# Patient Record
Sex: Male | Born: 1950 | Race: Black or African American | Hispanic: No | Marital: Married | Smoking: Never smoker
Health system: Southern US, Community
[De-identification: ages and names within clinical notes are randomized; demographics above are authoritative.]

## PROBLEM LIST (undated history)

## (undated) DIAGNOSIS — H5789 Other specified disorders of eye and adnexa: Secondary | ICD-10-CM

## (undated) DIAGNOSIS — K635 Polyp of colon: Secondary | ICD-10-CM

## (undated) DIAGNOSIS — I10 Essential (primary) hypertension: Secondary | ICD-10-CM

## (undated) DIAGNOSIS — E7800 Pure hypercholesterolemia, unspecified: Secondary | ICD-10-CM

## (undated) DIAGNOSIS — C61 Malignant neoplasm of prostate: Secondary | ICD-10-CM

## (undated) HISTORY — PX: ROTATOR CUFF REPAIR: SHX139

## (undated) HISTORY — PX: KNEE CARTILAGE SURGERY: SHX688

## (undated) HISTORY — DX: Polyp of colon: K63.5

## (undated) HISTORY — PX: PROSTATE BIOPSY: SHX241

---

## 2002-12-02 ENCOUNTER — Ambulatory Visit (HOSPITAL_COMMUNITY): Admission: RE | Admit: 2002-12-02 | Discharge: 2002-12-02 | Payer: Self-pay | Admitting: Gastroenterology

## 2006-08-26 ENCOUNTER — Ambulatory Visit (HOSPITAL_BASED_OUTPATIENT_CLINIC_OR_DEPARTMENT_OTHER): Admission: RE | Admit: 2006-08-26 | Discharge: 2006-08-26 | Payer: Self-pay | Admitting: Orthopedic Surgery

## 2012-09-06 ENCOUNTER — Ambulatory Visit: Payer: 59

## 2012-09-06 ENCOUNTER — Ambulatory Visit (INDEPENDENT_AMBULATORY_CARE_PROVIDER_SITE_OTHER): Payer: 59 | Admitting: Family Medicine

## 2012-09-06 VITALS — BP 136/80 | HR 90 | Temp 99.4°F | Resp 16 | Ht 73.0 in | Wt 191.0 lb

## 2012-09-06 DIAGNOSIS — R059 Cough, unspecified: Secondary | ICD-10-CM

## 2012-09-06 DIAGNOSIS — J019 Acute sinusitis, unspecified: Secondary | ICD-10-CM

## 2012-09-06 DIAGNOSIS — R05 Cough: Secondary | ICD-10-CM

## 2012-09-06 DIAGNOSIS — J309 Allergic rhinitis, unspecified: Secondary | ICD-10-CM

## 2012-09-06 MED ORDER — FLUTICASONE PROPIONATE 50 MCG/ACT NA SUSP: 2.0000 | Freq: Every day | NASAL | Status: DC

## 2012-09-06 MED ORDER — AMOXICILLIN-POT CLAVULANATE 875-125 MG PO TABS: 1.0000 | ORAL_TABLET | Freq: Two times a day (BID) | ORAL | Status: DC

## 2012-09-06 NOTE — Progress Notes (Signed)
  [facility]:  Office Visit  Chief Complaint:  Chief Complaint  Patient presents with  . Cough    productive cough body aches chills nausea x 1 week  wife dix'd with pneumonia  . Headache  . Sore Throat    HPI: Henry Harrison is a 62 y.o. male who complains of  Cough x 1 week, dry, choked on possible mucus this AM so decided to come in. + chills. + frontal Ha. + buzzing in ear. No fevers. Tried Robitussin this AM x 1 with minimal reief. Wife  Dx with PNA this week. Nonsmoker. Denies allergies or asthma. Denies CP, wheezing, SOB.  History reviewed. No pertinent past medical history. History reviewed. No pertinent past surgical history. History   Social History  . Marital Status: Married    Spouse Name: N/A    Number of Children: N/A  . Years of Education: N/A   Social History Main Topics  . Smoking status: Never Smoker   . Smokeless tobacco: None  . Alcohol Use: No  . Drug Use: No  . Sexually Active: None   Other Topics Concern  . None   Social History Narrative  . None   Family History  Problem Relation Age of Onset  . Diabetes Mother    No Known Allergies Prior to Admission medications   Medication Sig Start Date End Date Taking? Authorizing Provider  guaiFENesin (ROBITUSSIN) 100 MG/5ML liquid Take 200 mg by mouth 3 (three) times daily as needed for cough.   Yes Historical Provider, MD     ROS: The patient denies fevers, night sweats, unintentional weight loss, chest pain, palpitations, wheezing, dyspnea on exertion, nausea, vomiting, abdominal pain, dysuria, hematuria, melena, numbness, weakness, or tingling.  All other systems have been reviewed and were otherwise negative with the exception of those mentioned in the HPI and as above.    PHYSICAL EXAM: Filed Vitals:   09/06/12 0821  BP: 136/80  Pulse: 90  Temp: 99.4 F (37.4 C)  Resp: 16   Filed Vitals:   09/06/12 0821  Height: 6\' 1"  (1.854 m)  Weight: 191 lb (86.637 kg)    Body mass index is 25.2 kg/(m^2).  General: Alert, no acute distress HEENT:  Normocephalic, atraumatic, oropharynx patent. + frontal sinus tenderness, erythematous nares. No exudates Cardiovascular:  Regular rate and rhythm, no rubs murmurs or gallops.  No Carotid bruits, radial pulse intact. No pedal edema.  Respiratory: Clear to auscultation bilaterally.  No wheezes, rales, or rhonchi.  No cyanosis, no use of accessory musculature GI: No organomegaly, abdomen is soft and non-tender, positive bowel sounds.  No masses. Skin: No rashes. Neurologic: Facial musculature symmetric. Psychiatric: Patient is appropriate throughout our interaction. Lymphatic: No cervical lymphadenopathy Musculoskeletal: Gait intact.   LABS: No results found for this or any previous visit.   EKG/XRAY:   Primary read interpreted by Dr. Conley Rolls at [facility]. No infiltrates, no pneumothorax No effusion ? Left upper lobe nodule 7 mm vs vascular changes   ASSESSMENT/PLAN: Encounter Diagnoses  Name Primary?  . Cough Yes  . Acute sinusitis   . Allergic rhinitis     Start taking claritin  Rx Flonase Rx Augmentin if no improvement in 3-4 days F/u prn   LE, THAO PHUONG, DO 09/06/2012 9:25 AM

## 2012-09-06 NOTE — Patient Instructions (Addendum)

## 2012-10-09 ENCOUNTER — Other Ambulatory Visit: Payer: Self-pay | Admitting: Gastroenterology

## 2013-02-26 ENCOUNTER — Encounter (HOSPITAL_COMMUNITY): Payer: Self-pay | Admitting: Emergency Medicine

## 2013-02-26 ENCOUNTER — Emergency Department (HOSPITAL_COMMUNITY)
Admission: EM | Admit: 2013-02-26 | Discharge: 2013-02-26 | Disposition: A | Discharge status: Home / Self Care | Payer: 59 | Attending: Emergency Medicine | Admitting: Emergency Medicine

## 2013-02-26 DIAGNOSIS — X58XXXA Exposure to other specified factors, initial encounter: Secondary | ICD-10-CM | POA: Insufficient documentation

## 2013-02-26 DIAGNOSIS — I10 Essential (primary) hypertension: Secondary | ICD-10-CM | POA: Insufficient documentation

## 2013-02-26 DIAGNOSIS — S161XXA Strain of muscle, fascia and tendon at neck level, initial encounter: Secondary | ICD-10-CM

## 2013-02-26 DIAGNOSIS — Y939 Activity, unspecified: Secondary | ICD-10-CM | POA: Insufficient documentation

## 2013-02-26 DIAGNOSIS — E7800 Pure hypercholesterolemia, unspecified: Secondary | ICD-10-CM | POA: Insufficient documentation

## 2013-02-26 DIAGNOSIS — S139XXA Sprain of joints and ligaments of unspecified parts of neck, initial encounter: Secondary | ICD-10-CM | POA: Insufficient documentation

## 2013-02-26 DIAGNOSIS — Y929 Unspecified place or not applicable: Secondary | ICD-10-CM | POA: Insufficient documentation

## 2013-02-26 DIAGNOSIS — Z79899 Other long term (current) drug therapy: Secondary | ICD-10-CM | POA: Insufficient documentation

## 2013-02-26 HISTORY — DX: Pure hypercholesterolemia, unspecified: E78.00

## 2013-02-26 HISTORY — DX: Essential (primary) hypertension: I10

## 2013-02-26 MED ORDER — DIAZEPAM 5 MG PO TABS: 5.0000 mg | ORAL_TABLET | Freq: Four times a day (QID) | ORAL | Status: DC | PRN

## 2013-02-26 MED ORDER — DIAZEPAM 5 MG/ML IJ SOLN: 2.5000 mg | Freq: Once | INTRAMUSCULAR | Status: DC

## 2013-02-26 MED ORDER — DIAZEPAM 5 MG PO TABS
2.5000 mg | ORAL_TABLET | Freq: Once | ORAL | Status: AC
  Administered 2013-02-26: 2.5 mg via ORAL
  Filled 2013-02-26: qty 1

## 2013-02-26 NOTE — ED Provider Notes (Signed)
Medical screening examination/treatment/procedure(s) were performed by non-physician practitioner and as supervising physician I was immediately available for consultation/collaboration.     Sheffield Hawker M Maryella Abood, MD 02/26/13 0233 

## 2013-02-26 NOTE — ED Provider Notes (Signed)
CSN: [id number]     Arrival date & time 02/26/13  0003 History   First MD Initiated Contact with Patient 02/26/13 0017     Chief Complaint  Patient presents with  . Torticollis   (Consider location/radiation/quality/duration/timing/severity/associated sxs/prior Treatment) HPI History provided by pt.   Pt presents w/ severe, sharp/spasming, L posterior neck pain x 2 days.  Radiates to L side of head. Constant but aggravated by movement.  Mild relief w/ warm compress.  Associated w/ HTN as well as 1 sec episodes of imbalance upon standing.  Denies fever, vision changes, ataxia, extremity weakness/paresthesias, CP/SOB.  Denies trauma. Pt concerned d/t FH cerebral aneurysm.    Past Medical History  Diagnosis Date  . Hypertension   . Hypercholesteremia    Past Surgical History  Procedure Laterality Date  . Rotator cuff repair     Family History  Problem Relation Age of Onset  . Diabetes Mother    History  Substance Use Topics  . Smoking status: Never Smoker   . Smokeless tobacco: Not on file  . Alcohol Use: No    Review of Systems  All other systems reviewed and are negative.    Allergies  Review of patient's allergies indicates no known allergies.  Home Medications   Current Outpatient Rx  Name  Route  Sig  Dispense  Refill  . b complex vitamins tablet   Oral   Take 1 tablet by mouth daily.         . IRON PO   Oral   Take 1 tablet by mouth daily.         Marland Kitchen OVER THE COUNTER MEDICATION   Oral   Take 2 capsules by mouth 2 (two) times daily. Marine B3         . ramipril (ALTACE) 5 MG capsule   Oral   Take 5 mg by mouth daily.          BP 164/104  Pulse 109  Temp(Src) 99.1 F (37.3 C) (Oral)  Resp 20  Ht 6\' 2"  (1.88 m)  Wt 190 lb (86.183 kg)  BMI 24.38 kg/m2  SpO2 95% Physical Exam  Nursing note and vitals reviewed. Constitutional: He is oriented to person, place, and time. He appears well-developed and well-nourished. No distress.  HENT:   Head: Normocephalic and atraumatic.  Eyes:  Normal appearance  Neck: Normal range of motion. Neck supple.  No carotid bruit   Cardiovascular: Normal rate, regular rhythm and intact distal pulses.   hypertensive  Pulmonary/Chest: Effort normal and breath sounds normal. No stridor.  Musculoskeletal: Normal range of motion.  Cervical spine non-tender.  Tenderness entire L trap.  Pain w/ minimal head rotation.    Neurological: He is alert and oriented to person, place, and time. No sensory deficit. Coordination normal.  CN 3-12 intact.  No nystagmus. 5/5 and equal upper and lower extremity strength.  No past pointing.     Skin: Skin is warm and dry. No rash noted.  Psychiatric: He has a normal mood and affect. His behavior is normal.    ED Course  Procedures (including critical care time) Labs Review Labs Reviewed - No data to display Imaging Review No results found.  EKG Interpretation   None       MDM   1. Cervical strain, initial encounter    62yo M w/ HTN and hypercholesterolemia presents w/ non-traumatic L posterior neck pain.  He is concerned because of associated HTN as well as FH cerebral aneurysm.  Exam most consistent w/ cervical strain.  No focal neuro deficits or meningeal signs.  No mid-line cervical ttp.  Pt reassured.  2.5mg  po valium ordered. Will reassess shortly.  1:07 AM   Pain has started to improve.  He is able to rotate his head a little easier.  BP mildly improved as well.  Will give him another 2.5mg  valium and d/c home w/ short course of same.  Recommended heating pad and avoidance of aggravating activities.  Strict return precautions discussed. 2:22 AM     Otilio Miu, PA-C 02/26/13 [phone]

## 2013-02-26 NOTE — ED Notes (Signed)
Pt c/o L sided neck pain onset yesterday, denies injury.

## 2013-07-01 ENCOUNTER — Ambulatory Visit
Admission: RE | Admit: 2013-07-01 | Discharge: 2013-07-01 | Disposition: A | Discharge status: Home / Self Care | Payer: 59 | Source: Ambulatory Visit | Attending: Internal Medicine | Admitting: Internal Medicine

## 2013-07-01 ENCOUNTER — Other Ambulatory Visit: Payer: Self-pay | Admitting: Internal Medicine

## 2013-07-01 DIAGNOSIS — M25519 Pain in unspecified shoulder: Secondary | ICD-10-CM

## 2013-07-13 ENCOUNTER — Other Ambulatory Visit: Payer: Self-pay | Admitting: Radiology

## 2013-07-13 ENCOUNTER — Other Ambulatory Visit: Payer: Self-pay | Admitting: Internal Medicine

## 2013-07-13 DIAGNOSIS — M25519 Pain in unspecified shoulder: Secondary | ICD-10-CM

## 2013-07-18 ENCOUNTER — Ambulatory Visit
Admission: RE | Admit: 2013-07-18 | Discharge: 2013-07-18 | Disposition: A | Discharge status: Home / Self Care | Payer: 59 | Source: Ambulatory Visit | Attending: Internal Medicine | Admitting: Internal Medicine

## 2013-07-18 DIAGNOSIS — M25519 Pain in unspecified shoulder: Secondary | ICD-10-CM

## 2013-07-21 ENCOUNTER — Ambulatory Visit
Admission: RE | Admit: 2013-07-21 | Discharge: 2013-07-21 | Disposition: A | Discharge status: Home / Self Care | Payer: 59 | Source: Ambulatory Visit | Attending: Internal Medicine | Admitting: Internal Medicine

## 2013-08-12 ENCOUNTER — Encounter (HOSPITAL_COMMUNITY): Payer: Self-pay | Admitting: Pharmacy Technician

## 2013-08-17 NOTE — Pre-Procedure Instructions (Signed)
Henry Harrison  08/17/2013   Your procedure is scheduled on:  Thurs, April 16 @ 1:00 PM  Report to [facility]  at 11:00 AM.  Call this number if you have problems the morning of surgery: [phone]   Remember:   Do not eat food or drink liquids after midnight.   Take these medicines the morning of surgery with A SIP OF WATER: Eye Drops(if needed)              No Goody's,BC's,Aleve,Aspirin,Ibuprofen,Fish Oil,or any Herbal Medications   Do not wear jewelry  Do not wear lotions, powders, or colognes. You may wear deodorant.  Men may shave face and neck.  Do not bring valuables to the hospital.  [facility] is not responsible                  for any belongings or valuables.               Contacts, dentures or bridgework may not be worn into surgery.  Leave suitcase in the car. After surgery it may be brought to your room.  For patients admitted to the hospital, discharge time is determined by your                treatment team.               Patients discharged the day of surgery will not be allowed to drive  home.    Special Instructions:  [facility] - Preparing for Surgery  Before surgery, you can play an important role.  Because skin is not sterile, your skin needs to be as free of germs as possible.  You can reduce the number of germs on you skin by washing with CHG (chlorahexidine gluconate) soap before surgery.  CHG is an antiseptic cleaner which kills germs and bonds with the skin to continue killing germs even after washing.  Please DO NOT use if you have an allergy to CHG or antibacterial soaps.  If your skin becomes reddened/irritated stop using the CHG and inform your nurse when you arrive at Short Stay.  Do not shave (including legs and underarms) for at least 48 hours prior to the first CHG shower.  You may shave your face.  Please follow these instructions carefully:   1.  Shower with CHG Soap the night before surgery and the                                 morning of Surgery.  2.  If you choose to wash your hair, wash your hair first as usual with your       normal shampoo.  3.  After you shampoo, rinse your hair and body thoroughly to remove the                      Shampoo.  4.  Use CHG as you would any other liquid soap.  You can apply chg directly       to the skin and wash gently with scrungie or a clean washcloth.  5.  Apply the CHG Soap to your body ONLY FROM THE NECK DOWN.        Do not use on open wounds or open sores.  Avoid contact with your eyes,       ears, mouth and genitals (private parts).  Wash genitals (private parts)  with your normal soap.  6.  Wash thoroughly, paying special attention to the area where your surgery        will be performed.  7.  Thoroughly rinse your body with warm water from the neck down.  8.  DO NOT shower/wash with your normal soap after using and rinsing off       the CHG Soap.  9.  Pat yourself dry with a clean towel.            10.  Wear clean pajamas.            11.  Place clean sheets on your bed the night of your first shower and do not        sleep with pets.  Day of Surgery  Do not apply any lotions/deoderants the morning of surgery.  Please wear clean clothes to the hospital/surgery center.     Please read over the following fact sheets that you were given: Pain Booklet, Coughing and Deep Breathing and Surgical Site Infection Prevention

## 2013-08-18 ENCOUNTER — Encounter (HOSPITAL_COMMUNITY)
Admission: RE | Admit: 2013-08-18 | Discharge: 2013-08-18 | Disposition: A | Discharge status: Home / Self Care | Payer: 59 | Source: Ambulatory Visit | Attending: Orthopedic Surgery | Admitting: Orthopedic Surgery

## 2013-08-18 ENCOUNTER — Encounter (HOSPITAL_COMMUNITY): Payer: Self-pay

## 2013-08-18 LAB — COMPREHENSIVE METABOLIC PANEL
ALBUMIN: 3.8 g/dL (ref 3.5–5.2)
ALT: 20 U/L (ref 0–53)
AST: 25 U/L (ref 0–37)
Alkaline Phosphatase: 74 U/L (ref 39–117)
BILIRUBIN TOTAL: 0.3 mg/dL (ref 0.3–1.2)
BUN: 17 mg/dL (ref 6–23)
CHLORIDE: 102 meq/L (ref 96–112)
CO2: 27 mEq/L (ref 19–32)
Calcium: 9.1 mg/dL (ref 8.4–10.5)
Creatinine, Ser: 0.94 mg/dL (ref 0.50–1.35)
GFR calc Af Amer: >90 mL/min (ref >90)
GFR calc non Af Amer: 87 mL/min — ABNORMAL LOW (ref >90)
Glucose, Bld: 89 mg/dL (ref 70–99)
Potassium: 4.3 mEq/L (ref 3.7–5.3)
SODIUM: 141 meq/L (ref 137–147)
Total Protein: 7.4 g/dL (ref 6.0–8.3)

## 2013-08-18 LAB — CBC WITH DIFFERENTIAL/PLATELET
BASOS ABS: 0 10*3/uL (ref 0.0–0.1)
Basophils Relative: 1 % (ref 0–1)
Eosinophils Absolute: 0.4 10*3/uL (ref 0.0–0.7)
Eosinophils Relative: 8 % — ABNORMAL HIGH (ref 0–5)
HCT: 43 % (ref 39.0–52.0)
HEMOGLOBIN: 14.2 g/dL (ref 13.0–17.0)
Lymphocytes Relative: 32 % (ref 12–46)
Lymphs Abs: 1.4 10*3/uL (ref 0.7–4.0)
MCH: 26.5 pg (ref 26.0–34.0)
MCHC: 33 g/dL (ref 30.0–36.0)
MCV: 80.4 fL (ref 78.0–100.0)
Monocytes Absolute: 0.3 10*3/uL (ref 0.1–1.0)
Monocytes Relative: 7 % (ref 3–12)
NEUTROS ABS: 2.3 10*3/uL (ref 1.7–7.7)
NEUTROS PCT: 52 % (ref 43–77)
PLATELETS: 161 10*3/uL (ref 150–400)
RBC: 5.35 MIL/uL (ref 4.22–5.81)
RDW: 13.8 % (ref 11.5–15.5)
WBC: 4.3 10*3/uL (ref 4.0–10.5)

## 2013-08-18 LAB — PROTIME-INR
INR: 1.02 (ref 0.00–1.49)
Prothrombin Time: 13.2 seconds (ref 11.6–15.2)

## 2013-08-18 LAB — APTT: APTT: 30 s (ref 24–37)

## 2013-08-18 NOTE — Progress Notes (Signed)
PCP is Dr Denton Ar hussain Denies seeing a cardiologist. Denies having a recent EKG and Echo. Denies ever having a stress test or card cath.

## 2013-08-19 MED ORDER — CHLORHEXIDINE GLUCONATE 4 % EX LIQD
60.0000 mL | Freq: Once | CUTANEOUS | Status: DC
  Filled 2013-08-19: qty 60

## 2013-08-19 MED ORDER — LACTATED RINGERS IV SOLN
INTRAVENOUS | Status: DC
  Administered 2013-08-20 (×2): via INTRAVENOUS

## 2013-08-19 MED ORDER — CEFAZOLIN SODIUM-DEXTROSE 2-3 GM-% IV SOLR
2.0000 g | INTRAVENOUS | Status: AC
  Administered 2013-08-20: 2 g via INTRAVENOUS
  Filled 2013-08-19: qty 50

## 2013-08-20 ENCOUNTER — Encounter (HOSPITAL_COMMUNITY): Payer: Self-pay | Admitting: Anesthesiology

## 2013-08-20 ENCOUNTER — Ambulatory Visit (HOSPITAL_COMMUNITY): Payer: 59 | Admitting: Anesthesiology

## 2013-08-20 ENCOUNTER — Encounter (HOSPITAL_COMMUNITY): Payer: 59 | Admitting: Anesthesiology

## 2013-08-20 ENCOUNTER — Encounter (HOSPITAL_COMMUNITY)
Admission: RE | Disposition: A | Discharge status: Home / Self Care | Payer: Self-pay | Source: Ambulatory Visit | Attending: Orthopedic Surgery

## 2013-08-20 ENCOUNTER — Ambulatory Visit (HOSPITAL_COMMUNITY)
Admission: RE | Admit: 2013-08-20 | Discharge: 2013-08-20 | Disposition: A | Discharge status: Home / Self Care | Payer: 59 | Source: Ambulatory Visit | Attending: Orthopedic Surgery | Admitting: Orthopedic Surgery

## 2013-08-20 DIAGNOSIS — W19XXXA Unspecified fall, initial encounter: Secondary | ICD-10-CM | POA: Insufficient documentation

## 2013-08-20 DIAGNOSIS — M719 Bursopathy, unspecified: Principal | ICD-10-CM | POA: Insufficient documentation

## 2013-08-20 DIAGNOSIS — Z01812 Encounter for preprocedural laboratory examination: Secondary | ICD-10-CM | POA: Insufficient documentation

## 2013-08-20 DIAGNOSIS — M19019 Primary osteoarthritis, unspecified shoulder: Secondary | ICD-10-CM | POA: Insufficient documentation

## 2013-08-20 DIAGNOSIS — Z01810 Encounter for preprocedural cardiovascular examination: Secondary | ICD-10-CM | POA: Insufficient documentation

## 2013-08-20 DIAGNOSIS — M67919 Unspecified disorder of synovium and tendon, unspecified shoulder: Secondary | ICD-10-CM | POA: Insufficient documentation

## 2013-08-20 DIAGNOSIS — M25819 Other specified joint disorders, unspecified shoulder: Secondary | ICD-10-CM | POA: Insufficient documentation

## 2013-08-20 DIAGNOSIS — S43429A Sprain of unspecified rotator cuff capsule, initial encounter: Secondary | ICD-10-CM | POA: Insufficient documentation

## 2013-08-20 DIAGNOSIS — M7580 Other shoulder lesions, unspecified shoulder: Secondary | ICD-10-CM

## 2013-08-20 DIAGNOSIS — E7800 Pure hypercholesterolemia, unspecified: Secondary | ICD-10-CM | POA: Insufficient documentation

## 2013-08-20 DIAGNOSIS — I10 Essential (primary) hypertension: Secondary | ICD-10-CM | POA: Insufficient documentation

## 2013-08-20 HISTORY — PX: SHOULDER ARTHROSCOPY WITH SUBACROMIAL DECOMPRESSION, ROTATOR CUFF REPAIR AND BICEP TENDON REPAIR: SHX5687

## 2013-08-20 SURGERY — SHOULDER ARTHROSCOPY WITH SUBACROMIAL DECOMPRESSION, ROTATOR CUFF REPAIR AND BICEP TENDON REPAIR
Anesthesia: Regional | Site: Shoulder | Laterality: Left

## 2013-08-20 MED ORDER — FENTANYL CITRATE 0.05 MG/ML IJ SOLN
INTRAMUSCULAR | Status: AC
  Administered 2013-08-20: 50 ug
  Filled 2013-08-20: qty 2

## 2013-08-20 MED ORDER — LACTATED RINGERS IV SOLN
INTRAVENOUS | Status: DC
  Administered 2013-08-20: 11:00:00 via INTRAVENOUS

## 2013-08-20 MED ORDER — PROPOFOL 10 MG/ML IV BOLUS
INTRAVENOUS | Status: DC | PRN
  Administered 2013-08-20: 190 mg via INTRAVENOUS

## 2013-08-20 MED ORDER — FENTANYL CITRATE 0.05 MG/ML IJ SOLN
INTRAMUSCULAR | Status: AC
  Filled 2013-08-20: qty 5

## 2013-08-20 MED ORDER — LIDOCAINE HCL (CARDIAC) 20 MG/ML IV SOLN
INTRAVENOUS | Status: DC | PRN
  Administered 2013-08-20: 40 mg via INTRAVENOUS

## 2013-08-20 MED ORDER — NEOSTIGMINE METHYLSULFATE 1 MG/ML IJ SOLN
INTRAMUSCULAR | Status: DC | PRN
  Administered 2013-08-20: 3 mg via INTRAVENOUS

## 2013-08-20 MED ORDER — ROCURONIUM BROMIDE 100 MG/10ML IV SOLN
INTRAVENOUS | Status: DC | PRN
  Administered 2013-08-20: 50 mg via INTRAVENOUS

## 2013-08-20 MED ORDER — PROPOFOL 10 MG/ML IV BOLUS
INTRAVENOUS | Status: AC
  Filled 2013-08-20: qty 20

## 2013-08-20 MED ORDER — ONDANSETRON HCL 4 MG/2ML IJ SOLN
INTRAMUSCULAR | Status: DC | PRN
  Administered 2013-08-20: 4 mg via INTRAVENOUS

## 2013-08-20 MED ORDER — MIDAZOLAM HCL 2 MG/2ML IJ SOLN
INTRAMUSCULAR | Status: AC
Start: 2013-08-20 — End: 2013-08-20
  Administered 2013-08-20: 1 mg
  Filled 2013-08-20: qty 2

## 2013-08-20 MED ORDER — NAPROXEN 500 MG PO TABS: 500.0000 mg | ORAL_TABLET | Freq: Two times a day (BID) | ORAL | Status: DC

## 2013-08-20 MED ORDER — ONDANSETRON HCL 4 MG/2ML IJ SOLN
INTRAMUSCULAR | Status: AC
  Filled 2013-08-20: qty 2

## 2013-08-20 MED ORDER — NEOSTIGMINE METHYLSULFATE 1 MG/ML IJ SOLN
INTRAMUSCULAR | Status: AC
  Filled 2013-08-20: qty 10

## 2013-08-20 MED ORDER — ONDANSETRON HCL 4 MG/2ML IJ SOLN
4.0000 mg | Freq: Once | INTRAMUSCULAR | Status: AC
  Administered 2013-08-20: 4 mg via INTRAVENOUS

## 2013-08-20 MED ORDER — PHENYLEPHRINE HCL 10 MG/ML IJ SOLN
INTRAMUSCULAR | Status: DC | PRN
  Administered 2013-08-20 (×8): 80 ug via INTRAVENOUS

## 2013-08-20 MED ORDER — DIAZEPAM 5 MG PO TABS: 2.5000 mg | ORAL_TABLET | Freq: Four times a day (QID) | ORAL | Status: DC | PRN

## 2013-08-20 MED ORDER — ROCURONIUM BROMIDE 50 MG/5ML IV SOLN
INTRAVENOUS | Status: AC
  Filled 2013-08-20: qty 1

## 2013-08-20 MED ORDER — FENTANYL CITRATE 0.05 MG/ML IJ SOLN
INTRAMUSCULAR | Status: DC | PRN
Start: 2013-08-20 — End: 2013-08-20
  Administered 2013-08-20: 100 ug via INTRAVENOUS

## 2013-08-20 MED ORDER — GLYCOPYRROLATE 0.2 MG/ML IJ SOLN
INTRAMUSCULAR | Status: DC | PRN
  Administered 2013-08-20: 0.2 mg via INTRAVENOUS
  Administered 2013-08-20: 0.4 mg via INTRAVENOUS

## 2013-08-20 MED ORDER — OXYCODONE-ACETAMINOPHEN 5-325 MG PO TABS: 1.0000 | ORAL_TABLET | ORAL | Status: DC | PRN

## 2013-08-20 MED ORDER — GLYCOPYRROLATE 0.2 MG/ML IJ SOLN
INTRAMUSCULAR | Status: AC
  Filled 2013-08-20: qty 2

## 2013-08-20 MED ORDER — PHENYLEPHRINE 40 MCG/ML (10ML) SYRINGE FOR IV PUSH (FOR BLOOD PRESSURE SUPPORT)
PREFILLED_SYRINGE | INTRAVENOUS | Status: AC
  Filled 2013-08-20: qty 20

## 2013-08-20 SURGICAL SUPPLY — 64 items
ANCHOR PEEK 4.75X19.1 SWLK C (Anchor) ×6 IMPLANT
ANCHOR PEEK CORKSCREW 4.5 (Anchor) ×2 IMPLANT
ANCHOR PEEK CORKSCREW 4.5MM (Anchor) ×1 IMPLANT
BLADE CUTTER GATOR 3.5 (BLADE) ×3 IMPLANT
BLADE GREAT WHITE 4.2 (BLADE) ×2 IMPLANT
BLADE GREAT WHITE 4.2MM (BLADE) ×1
BLADE SURG 11 STRL SS (BLADE) ×3 IMPLANT
BOOTCOVER CLEANROOM LRG (PROTECTIVE WEAR) ×6 IMPLANT
BUR 3.5 LG SPHERICAL (BURR) IMPLANT
BUR OVAL 4.0 (BURR) ×3 IMPLANT
BURR 3.5 LG SPHERICAL (BURR)
BURR 3.5MM LG SPHERICAL (BURR)
CANISTER SUCT LVC 12 LTR MEDI- (MISCELLANEOUS) ×3 IMPLANT
CANNULA ACUFLEX KIT 5X76 (CANNULA) ×3 IMPLANT
CANNULA DRILOCK 5.0MMX75MM (CANNULA) ×3
CANNULA DRILOCK 5.0X75 (CANNULA) ×6 IMPLANT
CLOSURE WOUND 1/2 X4 (GAUZE/BANDAGES/DRESSINGS) ×1
CONNECTOR 5 IN 1 STRAIGHT STRL (MISCELLANEOUS) IMPLANT
DRAPE INCISE 23X17 IOBAN STRL (DRAPES) ×2
DRAPE INCISE IOBAN 23X17 STRL (DRAPES) ×1 IMPLANT
DRAPE INCISE IOBAN 66X45 STRL (DRAPES) ×3 IMPLANT
DRAPE STERI 35X30 U-POUCH (DRAPES) ×3 IMPLANT
DRAPE SURG 17X11 SM STRL (DRAPES) ×3 IMPLANT
DRAPE U-SHAPE 47X51 STRL (DRAPES) ×3 IMPLANT
DRSG PAD ABDOMINAL 8X10 ST (GAUZE/BANDAGES/DRESSINGS) ×6 IMPLANT
DURAPREP 26ML APPLICATOR (WOUND CARE) ×6 IMPLANT
ELECT REM PT RETURN 9FT ADLT (ELECTROSURGICAL) ×3
ELECTRODE REM PT RTRN 9FT ADLT (ELECTROSURGICAL) ×1 IMPLANT
GLOVE BIO SURGEON STRL SZ7.5 (GLOVE) ×3 IMPLANT
GLOVE BIO SURGEON STRL SZ8 (GLOVE) ×3 IMPLANT
GLOVE EUDERMIC 7 POWDERFREE (GLOVE) ×3 IMPLANT
GLOVE SS BIOGEL STRL SZ 7.5 (GLOVE) ×1 IMPLANT
GLOVE SUPERSENSE BIOGEL SZ 7.5 (GLOVE) ×2
GOWN STRL REUS W/ TWL LRG LVL3 (GOWN DISPOSABLE) ×1 IMPLANT
GOWN STRL REUS W/ TWL XL LVL3 (GOWN DISPOSABLE) ×4 IMPLANT
GOWN STRL REUS W/TWL LRG LVL3 (GOWN DISPOSABLE) ×3
GOWN STRL REUS W/TWL XL LVL3 (GOWN DISPOSABLE) ×8
KIT BASIN OR (CUSTOM PROCEDURE TRAY) ×3 IMPLANT
KIT ROOM TURNOVER OR (KITS) ×3 IMPLANT
KIT SHOULDER TRACTION (DRAPES) ×3 IMPLANT
MANIFOLD NEPTUNE II (INSTRUMENTS) ×3 IMPLANT
NDL SUT 6 .5 CRC .975X.05 MAYO (NEEDLE) IMPLANT
NEEDLE MAYO TAPER (NEEDLE)
NEEDLE SPNL 18GX3.5 QUINCKE PK (NEEDLE) ×3 IMPLANT
NS IRRIG 1000ML POUR BTL (IV SOLUTION) ×3 IMPLANT
PACK SHOULDER (CUSTOM PROCEDURE TRAY) ×3 IMPLANT
PAD ABD 8X10 STRL (GAUZE/BANDAGES/DRESSINGS) ×3 IMPLANT
PAD ARMBOARD 7.5X6 YLW CONV (MISCELLANEOUS) ×6 IMPLANT
SET ARTHROSCOPY TUBING (MISCELLANEOUS) ×2
SET ARTHROSCOPY TUBING LN (MISCELLANEOUS) ×1 IMPLANT
SLING ARM LRG ADULT FOAM STRAP (SOFTGOODS) ×3 IMPLANT
SLING ARM MED ADULT FOAM STRAP (SOFTGOODS) IMPLANT
SPONGE GAUZE 4X4 12PLY (GAUZE/BANDAGES/DRESSINGS) ×3 IMPLANT
SPONGE LAP 4X18 X RAY DECT (DISPOSABLE) ×3 IMPLANT
STRIP CLOSURE SKIN 1/2X4 (GAUZE/BANDAGES/DRESSINGS) ×2 IMPLANT
SUT MNCRL AB 3-0 PS2 18 (SUTURE) ×3 IMPLANT
SUT PDS AB 0 CT 36 (SUTURE) IMPLANT
SUT RETRIEVER GRASP 30 DEG (SUTURE) ×3 IMPLANT
SYR 20CC LL (SYRINGE) IMPLANT
TAPE PAPER 3X10 WHT MICROPORE (GAUZE/BANDAGES/DRESSINGS) ×3 IMPLANT
TOWEL OR 17X24 6PK STRL BLUE (TOWEL DISPOSABLE) ×3 IMPLANT
TOWEL OR 17X26 10 PK STRL BLUE (TOWEL DISPOSABLE) ×3 IMPLANT
WAND SUCTION MAX 4MM 90S (SURGICAL WAND) ×3 IMPLANT
WATER STERILE IRR 1000ML POUR (IV SOLUTION) ×3 IMPLANT

## 2013-08-20 NOTE — Anesthesia Preprocedure Evaluation (Addendum)
Anesthesia Evaluation  Patient identified by MRN, date of birth, ID band  Reviewed: Allergy & Precautions, H&P , NPO status , Patient's Chart, lab work & pertinent test results  Airway Mallampati: I      Dental  (+) Teeth Intact   Pulmonary          Cardiovascular hypertension, Pt. on medications     Neuro/Psych    GI/Hepatic   Endo/Other    Renal/GU      Musculoskeletal   Abdominal   Peds  Hematology   Anesthesia Other Findings   Reproductive/Obstetrics                          Anesthesia Physical Anesthesia Plan  ASA: II  Anesthesia Plan: General   Post-op Pain Management:    Induction: Intravenous  Airway Management Planned: Oral ETT  Additional Equipment:   Intra-op Plan:   Post-operative Plan: Extubation in OR  Informed Consent: I have reviewed the patients History and Physical, chart, labs and discussed the procedure including the risks, benefits and alternatives for the proposed anesthesia with the patient or authorized representative who has indicated his/her understanding and acceptance.   Dental advisory given  Plan Discussed with: CRNA and Anesthesiologist  Anesthesia Plan Comments:         Anesthesia Quick Evaluation

## 2013-08-20 NOTE — Discharge Instructions (Signed)
° °Kevin M. Supple, M.D., F.A.A.O.S. °Orthopaedic Surgery °Specializing in Arthroscopic and Reconstructive °Surgery of the Shoulder and Knee °[phone] °[address] - [place], [place] [address] - Fax [phone] ° °POST-OP SHOULDER ARTHROSCOPIC ROTATOR CUFF  REPAIR INSTRUCTIONS ° °1. Call the office at [phone] to schedule your first post-op appointment 7-10 days from the date of your surgery. ° °2. Leave the steri-strips in place over your incisions when performing dressing changes and showering. You may remove your dressings and begin showering 72 hours from surgery. You can expect drainage that is clear to bloody in nature that occasionally will soak through your dressings. If this occurs go ahead and perform a dressing change. The drainage should lessen daily and when there is no drainage from your incisions feel free to go without a dressing. ° °3. Wear your sling/immobilizer at all times except to perform the exercises below or to occasionally let your arm dangle by your side to stretch your elbow. You also need to sleep in your sling immobilizer until instructed otherwise. ° °4. Range of motion to your elbow, wrist, and hand are encouraged 3-5 times daily. Exercise to your hand and fingers helps to reduce swelling you may experience. ° °5. Utilize ice to the shoulder 3-4 times minimum a day and additionally if you are experiencing pain. ° °6. You may one-armed drive when safely off of narcotics and muscle relaxants. You may use your hand that is in the sling to support the steering wheel only. However, should it be your right arm that is in the sling it is not to be used for gear shifting in a manual transmission. ° °7. If you had a block pre-operatively to provide post-op pain relief you may want to go ahead and begin utilizing your pain meds as your arm begins to wake up. Blocks can sometimes last up to 16-18 hours. If you are still pain-free prior to going to bed you may want to  strongly consider taking a pain medication to avoid being awakened in the night with the onset of pain. A muscle relaxant is also provided for you should you experience muscle spasms. It is recommended that if you are experiencing pain that your pain medication alone is not controlling, add the muscle relaxant along with the pain medication which can give additional pain relief. The first one to two days is generally the most severe of your pain and then should gradually decrease. As your pain lessens it is recommended that you decrease your use of the pain medications to an "as needed basis" only and to always comply with the recommended dosages of the pain medications. ° °8. Pain medications can produce constipation along with their use. If you experience this, the use of an over the counter stool softener or laxative daily is recommended.  ° °9. For additional questions or concerns, please do not hesitate to call the office. If after hours there is an answering service to forward your concerns to the physician on call. ° °POST-OP EXERCISES ° °Pendulum Exercises ° °Perform pendulum exercises while standing and bending at the waist. Support your uninvolved arm on a table or chair and allow your operated arm to hang freely. Make sure to do these exercises passively - not using you shoulder muscle. ° °Repeat 20 times. Do 3 sessions per day. ° °What to eat: ° °For your first meals, you should eat lightly; only small meals initially.  If you do not have nausea, you may eat larger meals.    Avoid spicy, greasy and heavy food.   ° °General Anesthesia, Adult, Care After  °Refer to this sheet in the next few weeks. These instructions provide you with information on caring for yourself after your procedure. Your health care provider may also give you more specific instructions. Your treatment has been planned according to current medical practices, but problems sometimes occur. Call your health care provider if you have any  problems or questions after your procedure.  °WHAT TO EXPECT AFTER THE PROCEDURE  °After the procedure, it is typical to experience:  °Sleepiness.  °Nausea and vomiting. °HOME CARE INSTRUCTIONS  °For the first 24 hours after general anesthesia:  °Have a responsible person with you.  °Do not drive a car. If you are alone, do not take public transportation.  °Do not drink alcohol.  °Do not take medicine that has not been prescribed by your health care provider.  °Do not sign important papers or make important decisions.  °You may resume a normal diet and activities as directed by your health care provider.  °Change bandages (dressings) as directed.  °If you have questions or problems that seem related to general anesthesia, call the hospital and ask for the anesthetist or anesthesiologist on call. °SEEK MEDICAL CARE IF:  °You have nausea and vomiting that continue the day after anesthesia.  °You develop a rash. °SEEK IMMEDIATE MEDICAL CARE IF:  °You have difficulty breathing.  °You have chest pain.  °You have any allergic problems. °Document Released: 07/30/2000 Document Revised: 12/24/2012 Document Reviewed: 11/06/2012  °ExitCare® Patient Information ©2014 [employer].  ° ° °

## 2013-08-20 NOTE — Op Note (Signed)
08/20/2013  2:38 PM  PATIENT:   Henry Harrison Seen  63 y.o. 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  ASSISTANTS: [place]ord pac   ANESTHESIA:   GET + ISB  EBL: min  SPECIMEN:  none  Drains: none   PATIENT DISPOSITION:  PACU - hemodynamically stable.    PLAN OF CARE: Discharge to home after PACU  Dictation# [id number], [phone]

## 2013-08-20 NOTE — Anesthesia Postprocedure Evaluation (Signed)
  Anesthesia Post-op Note  Patient: Henry Harrison  Procedure(s) Performed: Procedure(s): LEFT SHOULDER ARTHROSCOPY WITH SUBACROMIAL DECOMPRESSION, DISTAL CLAVICLE RESECTION AND ROTATOR CUFF REPAIR  (Left)  Patient Location: PACU  Anesthesia Type:GA combined with regional for post-op pain  Level of Consciousness: awake and alert   Airway and Oxygen Therapy: Patient Spontanous Breathing  Post-op Pain: none  Post-op Assessment: Post-op Vital signs reviewed, Patient's Cardiovascular Status Stable, Respiratory Function Stable, Patent Airway, No signs of Nausea or vomiting and Pain level controlled  Post-op Vital Signs: Reviewed and stable  Last Vitals:  Filed Vitals:   08/20/13 1600  BP: 151/97  Pulse: 88  Temp: 36.4 C  Resp: 6    Complications: No apparent anesthesia complications

## 2013-08-20 NOTE — Anesthesia Procedure Notes (Addendum)
Procedure Name: Intubation Date/Time: 08/20/2013 12:59 PM Performed by: Rush Farmer E Pre-anesthesia Checklist: Patient identified, Emergency Drugs available, Suction available, Patient being monitored and Timeout performed Patient Re-evaluated:Patient Re-evaluated prior to inductionOxygen Delivery Method: Circle system utilized Preoxygenation: Pre-oxygenation with 100% oxygen Intubation Type: IV induction Laryngoscope Size: Mac and 4 Grade View: Grade I Tube type: Oral Tube size: 7.5 mm Number of attempts: 1 Airway Equipment and Method: Stylet Placement Confirmation: ETT inserted through vocal cords under direct vision,  positive ETCO2 and breath sounds checked- equal and bilateral Secured at: 22 cm Tube secured with: Tape Dental Injury: Teeth and Oropharynx as per pre-operative assessment     Anesthesia Regional Block:   Narrative:    Anesthesia Regional Block:  Interscalene brachial plexus block  Pre-Anesthetic Checklist: ,, timeout performed, Correct Patient, Correct Site, Correct Laterality, Correct Procedure, Correct Position, site marked, Risks and benefits discussed,  Surgical consent,  Pre-op evaluation,  At surgeon's request and post-op pain management  Laterality: Left  Prep: chloraprep       Needles:  Injection technique: Single-shot  Needle Type: Echogenic Stimulator Needle     Needle Length: 9cm 9 cm     Additional Needles: Interscalene brachial plexus block Narrative:  Start time: 09/07/2013 11:40 AM End time: 09/07/2013 11:45 AM Injection made incrementally with aspirations every 5 mL.  Performed by: Personally   Additional Notes: 25 cc 0.5% marcaine 1:200 Epi injected easily   Anesthesia Regional Block:   Narrative:

## 2013-08-20 NOTE — H&P (Signed)
[place] Seen    Chief Complaint: left shoulder rotator cuff tear HPI: The patient is a 63 y.o. male with persistant right shoulder pain and weakness after a fall with MRI evidence of a large rotator cuff tear.  Past Medical History  Diagnosis Date  . Hypertension   . Hypercholesteremia     Past Surgical History  Procedure Laterality Date  . Rotator cuff repair    . Knee cartilage surgery Right     Family History  Problem Relation Age of Onset  . Diabetes Mother     Social History:  reports that he has never smoked. He does not have any smokeless tobacco history on file. He reports that he drinks alcohol. He reports that he does not use illicit drugs.  Allergies: No Known Allergies  Medications Prior to Admission  Medication Sig Dispense Refill  . b complex vitamins tablet Take 1 tablet by mouth daily.      . IRON PO Take 1 tablet by mouth daily.      . Olopatadine HCl (PATADAY) 0.2 % SOLN Place 1 drop into both eyes daily as needed (allergies in eyes).      Marland Kitchen OVER THE COUNTER MEDICATION Take 1 tablet by mouth daily. True Capros      . OVER THE COUNTER MEDICATION Take 1 tablet by mouth 2 (two) times daily. Striction BP      . ramipril (ALTACE) 5 MG capsule Take 5 mg by mouth daily.      Marland Kitchen OVER THE COUNTER MEDICATION Take 2 capsules by mouth 2 (two) times daily. Marine B3         Physical Exam: left shoulder with painful restricted motion and weakness as noted at recent office visits  Vitals  Temp:  [98 F (36.7 C)] 98 F (36.7 C) (04/16 1106) Pulse Rate:  [79-93] 79 (04/16 1145) Resp:  [16-20] 18 (04/16 1145) BP: (159-171)/(95-103) 163/99 mmHg (04/16 1145) SpO2:  [100 %] 100 % (04/16 1145)  Assessment/Plan  Impression: left shoulder rotator cuff tear  Plan of Action: Procedure(s): LEFT SHOULDER ARTHROSCOPY WITH SUBACROMIAL DECOMPRESSION, DISTAL CLAVICLE RESECTION AND ROTATOR CUFF REPAIR   Metta Clines Aydrian Halpin 08/20/2013, 12:13 PM

## 2013-08-20 NOTE — Transfer of Care (Signed)
Immediate Anesthesia Transfer of Care Note  Patient: Henry Harrison  Procedure(s) Performed: Procedure(s): LEFT SHOULDER ARTHROSCOPY WITH SUBACROMIAL DECOMPRESSION, DISTAL CLAVICLE RESECTION AND ROTATOR CUFF REPAIR  (Left)  Patient Location: PACU  Anesthesia Type:GA combined with regional for post-op pain  Level of Consciousness: awake, alert , oriented and patient cooperative  Airway & Oxygen Therapy: Patient Spontanous Breathing and Patient connected to nasal cannula oxygen  Post-op Assessment: Report given to PACU RN and Post -op Vital signs reviewed and stable  Post vital signs: Reviewed and stable  Complications: No apparent anesthesia complications

## 2013-08-21 NOTE — Op Note (Signed)
NAME:  Henry Harrison, Henry Harrison NO.:  [id number]  MEDICAL RECORD NO.:  [id number]  LOCATION:  [facility]                         FACILITY:  [facility]  PHYSICIAN:  Metta Clines. Demitrios Molyneux, M.D.  DATE OF BIRTH:  02-25-1951  DATE OF PROCEDURE: DATE OF DISCHARGE:  08/20/2013                              OPERATIVE REPORT   ADDENDUM:  Surgical assistant, Jenetta Loges, PA-C.  Surgical assistant essential for help throughout this case for help with positioning of the patient, positioning of the extremity, management of the arthroscopic equipment, tissue manipulation, suture management, wound closure, and intraoperative decision making.     [name], M.D.     KMS/MEDQ  D:  08/20/2013  T:  08/21/2013  Job:  [id number]

## 2013-08-21 NOTE — Op Note (Signed)
NAME:  Henry Harrison, Henry Harrison NO.:  [id number]  MEDICAL RECORD NO.:  [id number]  LOCATION:  [facility]                         FACILITY:  [facility]  PHYSICIAN:  Metta Clines. Samiya Mervin, M.D.  DATE OF BIRTH:  08-30-1950  DATE OF PROCEDURE:  08/20/2013 DATE OF DISCHARGE:  08/20/2013                              OPERATIVE REPORT   PREOPERATIVE DIAGNOSES: 1. Left shoulder rotator cuff tears. 2. Left shoulder impingement syndrome. 3. Left shoulder acromioclavicular joint arthropathy.  POSTOPERATIVE DIAGNOSES: 1. 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 2. Left shoulder impingement. 3. Left shoulder acromioclavicular joint arthrosis. 4. Early glenohumeral joint arthrosis with broad area of full-     thickness chondral loss, cartilage loss on the humeral head. 5. Left shoulder labral tear.  PROCEDURES: 1. Left shoulder examination under anesthesia and left shoulder     glenohumeral joint diagnostic arthroscopy. 2. Labral debridement and limited synovectomy. 3. Arthroscopic subacromial decompression bursectomy. 4. Arthroscopic distal clavicle resection. 5. Arthroscopic rotator cuff repair of a full-thickness and likely     acute on chronic tear of the rotator cuff involving the majority of     the supraspinatus and upper infraspinatus using a double row     __________ construct.  SURGEON:  Metta Clines. Song Garris, M.D.  Terrence DupontOlivia Mackie A. Shuford, PA-C  ANESTHESIA:  General endotracheal as well as an interscalene block.  ESTIMATED BLOOD LOSS:  Minimal.  DRAINS:  None.  HISTORY:  Mr. Zeidman is a 63 year old gentleman who had a work- related injury to his left shoulder after a fall.  He has had a continued difficulties with pain, weakness and restrictions in mobility. His preoperative MRI scan does confirm a significant tear of the rotator cuff with moderate retraction.  Due to his ongoing pain, weakness and functional limitations, he is brought to the operating at this time  for planned left shoulder arthroscopy as described below.  Preoperatively, I counseled Ms. Simmon regarding treatment options as well as risks versus benefits thereof.  Possible surgical complications were all reviewed including the potential for bleeding, infection, neurovascular injury, persistent pain, loss of motion, anesthetic complication, recurrence of rotator cuff tear and possible need for additional surgery.  He understands and accepts and agrees with our planned procedure.  PROCEDURE IN DETAIL:  After undergoing routine preop evaluation, the patient did receive prophylactic antibiotics.  An interscalene block was established in the holding area by the Anesthesia Department.  Placed supine on the operating table, underwent smooth induction of a general endotracheal anesthesia.  Turned to the right lateral decubitus position on a beanbag and appropriately padded and protected.  Left shoulder examination under anesthesia revealed modest restrictions in mobility and gentle manipulation was performed.  No significant release of adhesions was noted and achieved approximately 160 degrees of forward elevation, 90 degrees of rotation.  Left arm was then suspended at 70 degrees of abduction with 10 pounds of traction.  Left shoulder girdle region was sterilely prepped and draped in standard fashion.  Time-out was called.  __________ glenohumeral joint __________ established under direct visualization.  Immediately evident was a significant synovitis within the glenohumeral joint as well as degenerative labral tearing. We performed a limited synovectomy as well  as labral debridement.  We were surprised to find a significant area of full-thickness cartilage loss over the central aspect of the humeral head approximately 3-4 cm in diameter of the circular zone of cartilage loss.  The glenoid was in good condition.  No instability patterns were noted.  We completed the debridement  within the glenohumeral joint.  Fluid was removed.  Of note, we did indeed find a full-thickness tear of the rotator cuff, but when viewed from the articular side, this did not appear to be significantly displaced.  At this point, fluid and instruments were removed.  The arm was dropped down to 30 degrees abduction with the arthroscope introduced into the subacromial space through the posterior portal and a direct lateral portal was established in the subacromial space.  Abundant dense bursal tissue and multiple adhesions were encountered and these were all divided and excised, with the shaver and the Stryker wand.  The wand was then used for the periosteum from the undersurface of the anterior half of the acromion and a subacromial decompression was performed with a bur creating type 1 morphology.  Portal was then established directly anterior to the distal clavicle and the distal clavicle resection was performed with a bur.  Care was taken to confirm visualization of entire circumference of the distal clavicle to ensure adequate removal of bone.  At this point, we completed inspection of the subacromial space and found that there was essentially a horizontal split and a cleavage tear through the rotator cuff from the bursal-sided leaflet of the torn rotator cuff, was significantly more retracted then the articular-sided portion and the bursal-sided portion which again involved predominantly the infraspinatus.  It was chronic in nature, and this tissue was markedly attenuated and with fatty infiltration and this was not readily mobilizable and as a matter of fact, was very scarred down.  We were, however, able to mobilize the articular side of the leaflet of this complex tear and this was what appears to have been the portion that was still functioning with the patient.  Given the fact that the bursal-sided portion of this tear which I would __________ for perhaps a 40% of thickness of  the tendon. This area was debrided and we focused on repairing the articular side of the tear which again was the majority of this tear.  This was mobilized. We trimmed the free margin of the tendon back to the healthy tissue and then prepared the greater tuberosity, removing soft tissue and gently abrading the bone.  An accessory portal __________ was established and through __________ margin of the acromion placed an Arthrex PEEK corkscrew suture anchor.  The limbs of the suture anchor were then placed through the free margin of the rotator cuff in a horizontal mattress pattern.  These were then tied with sliding locking knots followed by multiple overhand throws and alternating posts.  We then further created "suture bridge" with 2 swivel lock suture anchors, which nicely compressed the margin of the rotator cuff against the bony bed of the tuberosity.  The overall construct was much to our satisfaction, and again did identify that there was complex horizontal element and the majority of the tendon was on the articular side which we repaired to the satisfaction and the bursal side of tissues appeared to be chronically torn and quite friable and the irreparable.  At this point, we then completed the bursectomy.  Hemostasis was obtained.  Fluid and instrument was removed.  The portal was closed with Monocryl  and a Steri- Strips.  A dry dressing taped at the left shoulder and left arm was placed in a sling.  The patient was awakened, extubated, and taken to recovery room in stable condition.     Metta Clines. Jalyn Rosero, M.D.     KMS/MEDQ  D:  08/20/2013  T:  08/21/2013  Job:  [id number]

## 2013-08-24 ENCOUNTER — Encounter (HOSPITAL_COMMUNITY): Payer: Self-pay | Admitting: Orthopedic Surgery

## 2013-08-25 NOTE — Addendum Note (Signed)
Addendum created 08/25/13 1630 by Roberts Gaudy, MD   Modules edited: Anesthesia Attestations, Anesthesia Blocks and Procedures, Clinical Notes   Clinical Notes:  File: [id number]; File: [id number]; File: [id number]

## 2013-09-07 NOTE — Addendum Note (Signed)
Addendum created 09/07/13 0741 by Roberts Gaudy, MD   Modules edited: Anesthesia Blocks and Procedures, Clinical Notes   Clinical Notes:  File: [id number]; File: [id number]

## 2015-12-02 ENCOUNTER — Encounter: Payer: Self-pay | Admitting: Medical Oncology

## 2015-12-08 ENCOUNTER — Encounter: Payer: Self-pay | Admitting: Genetic Counselor

## 2015-12-08 ENCOUNTER — Telehealth: Payer: Self-pay | Admitting: Medical Oncology

## 2015-12-08 NOTE — Telephone Encounter (Signed)
Oncology Nurse Navigator Documentation  Oncology Nurse Navigator Flowsheets 12/02/2015 12/08/2015  Navigator Location [facility] -  Navigator Encounter Type Introductory phone call Telephone  Telephone - Outgoing Call;Appt- Confirmation/Clarification- Mr. Damboise confirmed his appointment for 8/4 arriving at 7:30am. He states that he has not received the packet of information that I mailed to him. I asked him not to worry and if he does not receive today I will have a set for him to fill out in the morning. He voiced understanding.  Abnormal Finding Date 09/05/2015 -  Confirmed Diagnosis Date 10/21/2015 -  Barriers/Navigation Needs Education -  Education Actor Options;Coping with Diagnosis/ Prognosis -  Interventions Education Method -  Education Method Teach-back;Verbal;Written -  Support Groups/Services Friends and Family -  Acuity Level 2 -  Acuity Level 2 Initial guidance, education and coordination as needed;Educational needs -  Time Spent with Patient 15 15

## 2015-12-08 NOTE — Progress Notes (Signed)
GU Location of Tumor / Histology: prostatic adenocarcinoma  If Prostate Cancer, Gleason Score is (3 + 4) and PSA is (4.48)  PEARLEY THRESHER presented 09/05/15 as a referral from Dr. Lysle Rubens for evaluation of elevated PSA.  March 2014 PSA 2.18 July 2013 PSA 3.46 March 2016 PSA 3.31 July 2015 PSA 4.1  Biopsies of prostate (if applicable) revealed:    Past/Anticipated interventions by urology, if any: biopsy, referral to [facility]  Past/Anticipated interventions by medical oncology, if any: no  Weight changes, if any: no  Bowel/Bladder complaints, if any: some trouble maintaining an erection  Nausea/Vomiting, if any: no  Pain issues, if any:  no  SAFETY ISSUES:  Prior radiation? no  Pacemaker/ICD? no  Possible current pregnancy? no  Is the patient on methotrexate? no  Current Complaints / other details:  65 year old male. Married with two children. Retired.

## 2015-12-08 NOTE — Progress Notes (Signed)
I called pt to introduce myself as the Prostate Nurse Navigator and the Coordinator of the [facility].  1. I confirmed with the patient he is aware of his referral to the clinic August 4th arriving at 7:30am.  2. I discussed the format of the clinic and the physicians he will be seeing that day.  3. I discussed where the clinic is located and how to contact me.  4. I confirmed his address and informed him I would be mailing a packet of information and forms to be completed. I asked him to bring them with him the day of his appointment.   He voiced understanding of the above. I asked him to call me if he has any questions or concerns regarding his appointments or the forms he needs to complete.

## 2015-12-09 ENCOUNTER — Ambulatory Visit
Admission: RE | Admit: 2015-12-09 | Discharge: 2015-12-09 | Disposition: A | Discharge status: Home / Self Care | Payer: Medicare Other | Source: Ambulatory Visit | Attending: Radiation Oncology | Admitting: Radiation Oncology

## 2015-12-09 ENCOUNTER — Encounter: Payer: Self-pay | Admitting: Medical Oncology

## 2015-12-09 ENCOUNTER — Ambulatory Visit (HOSPITAL_BASED_OUTPATIENT_CLINIC_OR_DEPARTMENT_OTHER): Payer: Medicare Other | Admitting: Oncology

## 2015-12-09 DIAGNOSIS — I10 Essential (primary) hypertension: Secondary | ICD-10-CM | POA: Insufficient documentation

## 2015-12-09 DIAGNOSIS — C61 Malignant neoplasm of prostate: Secondary | ICD-10-CM | POA: Diagnosis not present

## 2015-12-09 DIAGNOSIS — E7800 Pure hypercholesterolemia, unspecified: Secondary | ICD-10-CM | POA: Diagnosis not present

## 2015-12-09 HISTORY — DX: Malignant neoplasm of prostate: C61

## 2015-12-09 NOTE — Progress Notes (Signed)
Radiation Oncology         [phone] ________________________________  Initial Outpatient Consultation  Name: Henry Harrison MRN: [id number]  Date: 12/09/2015  DOB: [birth year]  [id number], MD  Henry Low, MD   REFERRING PHYSICIAN: Wenda Low, MD  DIAGNOSIS: 65 y.o. gentleman with stage T1c adenocarcinoma of the prostate with a Gleason's score of 3+4 and a PSA of 4.48    ICD-9-CM ICD-10-CM   1. Malignant neoplasm of prostate ([place]) [name] is a 65 y.o. gentleman.  He was noted to have an elevated PSA of 4.48 by his primary care physician, Dr. Lysle Harrison.  Accordingly, he was referred for evaluation in urology by Dr. Junious Harrison on 09/05/2015. Digital rectal examination was performed 10/21/2015 revealing prostate size 2+ with no palpable nodules.  The patient proceeded to transrectal ultrasound with 12 biopsies of the prostate on 10/21/2015.  The prostate volume measured 37 cc.  Out of 12 core biopsies, 5 were positive.  The maximum Gleason score was 3+4, and this was seen in the distribution below.     The patient reviewed the biopsy results with his urologist and he has kindly been referred today for discussion of potential radiation treatment options.  PREVIOUS RADIATION THERAPY: No  PAST MEDICAL HISTORY:  has a past medical history of Hypercholesteremia; Hypertension; and Prostate cancer ([place]).    PAST SURGICAL HISTORY: Past Surgical History:  Procedure Laterality Date  . KNEE CARTILAGE SURGERY Right   . PROSTATE BIOPSY    . ROTATOR CUFF REPAIR    . SHOULDER ARTHROSCOPY WITH SUBACROMIAL DECOMPRESSION, ROTATOR CUFF REPAIR AND BICEP TENDON REPAIR Left 08/20/2013   Procedure: LEFT SHOULDER ARTHROSCOPY WITH SUBACROMIAL DECOMPRESSION, DISTAL CLAVICLE RESECTION AND ROTATOR CUFF REPAIR ;  Surgeon: Henry Shutter, MD;  Location: [facility];  Service: Orthopedics;  Laterality: Left;    FAMILY HISTORY: family history includes Diabetes  in his mother.  SOCIAL HISTORY:  reports that he has never smoked. He has never used smokeless tobacco. He reports that he drinks alcohol. He reports that he does not use drugs.  ALLERGIES: Review of patient's allergies indicates no known allergies.  MEDICATIONS:  Current Outpatient Prescriptions  Medication Sig Dispense Refill  . b complex vitamins tablet Take 1 tablet by mouth daily.    . diazepam (VALIUM) 5 MG tablet Take 0.5-1 tablets (2.5-5 mg total) by mouth every 6 (six) hours as needed for muscle spasms or sedation. 40 tablet 1  . IRON PO Take 1 tablet by mouth daily.    . naproxen (NAPROSYN) 500 MG tablet Take 1 tablet (500 mg total) by mouth 2 (two) times daily with a meal. 60 tablet 1  . Olopatadine HCl (PATADAY) 0.2 % SOLN Place 1 drop into both eyes daily as needed (allergies in eyes).    Marland Kitchen OVER THE COUNTER MEDICATION Take 2 capsules by mouth 2 (two) times daily. Marine B3    . OVER THE COUNTER MEDICATION Take 1 tablet by mouth daily. True Capros    . OVER THE COUNTER MEDICATION Take 1 tablet by mouth 2 (two) times daily. Striction BP    . oxyCODONE-acetaminophen (PERCOCET) 5-325 MG per tablet Take 1-2 tablets by mouth every 4 (four) hours as needed. 40 tablet 0  . ramipril (ALTACE) 5 MG capsule Take 5 mg by mouth daily.     No current facility-administered medications for this encounter.     REVIEW OF SYSTEMS: On review of systems, the patient reports  that he is doing well overall. He denies any unintended weight changes. He denies any bowel disturbances. He denies abdominal pain, nausea or vomiting. He mentions he has some trouble maintaining an erection. A complete review of systems is obtained and is otherwise negative.  The patient completed an IPSS and IIEF questionnaire.  His IPSS score was 3 indicating mild urinary outflow obstructive symptoms. He indicated that his erectile function is able to complete sexual activity almost always.   PHYSICAL EXAM: This patient is in  no acute distress.  He is alert and oriented.   vitals were not taken for this visit. He exhibits no respiratory distress or labored breathing.  He appears neurologically intact.  His mood is pleasant.  His affect is appropriate.  Please note the digital rectal exam findings described above.  KPS = 100  100 - Normal; no complaints; no evidence of disease. 90   - Able to carry on normal activity; minor signs or symptoms of disease. 80   - Normal activity with effort; some signs or symptoms of disease. 34   - Cares for self; unable to carry on normal activity or to do active work. 60   - Requires occasional assistance, but is able to care for most of his personal needs. 50   - Requires considerable assistance and frequent medical care. 35   - Disabled; requires special care and assistance. 74   - Severely disabled; hospital admission is indicated although death not imminent. 18   - Very sick; hospital admission necessary; active supportive treatment necessary. 10   - Moribund; fatal processes progressing rapidly. 0     - Dead  Karnofsky DA, Abelmann Purcell, Craver LS and Webster JH [phone]) The use of the nitrogen mustards in the palliative treatment of carcinoma: with particular reference to bronchogenic carcinoma Cancer 1 634-56   LABORATORY DATA:  Lab Results  Component Value Date   WBC 4.3 08/18/2013   HGB 14.2 08/18/2013   HCT 43.0 08/18/2013   MCV 80.4 08/18/2013   PLT 161 08/18/2013   Lab Results  Component Value Date   NA 141 08/18/2013   K 4.3 08/18/2013   CL 102 08/18/2013   CO2 27 08/18/2013   Lab Results  Component Value Date   ALT 20 08/18/2013   AST 25 08/18/2013   ALKPHOS 74 08/18/2013   BILITOT 0.3 08/18/2013     RADIOGRAPHY: No results found.    IMPRESSION: This gentleman is a 65 yo gentleman with adenocarcinoma of the prostate.  His T-Stage, Gleason's Score, and PSA put him into the intermediate risk group.  Accordingly he is eligible for a variety of  potential treatment options including external radiotherapy, seed implant, or surgery.  PLAN: Today, I talked to the patient and family about the findings and work-up thus far.  We discussed the natural history of prostate cancer and general treatment, highlighting the role of radiotherapy in the management.  We discussed the available radiation techniques, and focused on the details of logistics and delivery.  We reviewed the anticipated acute and late sequelae associated with radiation in this setting. The patient was encouraged to ask questions that I answered to the best of my ability. We discussed that he is also a candidate for surgery and he is leaning towards a prostatectomy at this time.   I spent 40 minutes face to face with the patient and more than 50% of that time was spent in counseling and/or coordination of care.   ------------------------------------------------  Sheral Apley Tammi Klippel, M.D.    This document serves as a record of services personally performed by Tyler Pita, MD. It was created on his behalf by Lendon Collar, a trained medical scribe. The creation of this record is based on the scribe's personal observations and the provider's statements to them. This document has been checked and approved by the attending provider.

## 2015-12-09 NOTE — Progress Notes (Signed)
Reason for Referral: Prostate cancer.   HPI: 65 year old gentleman currently of [place] where he lived the majority of his life. He is a reasonably healthy gentleman with history of hypertension and hyperlipidemia who was found to have an elevated PSA of 4.48. He was evaluated by Dr. Junious Silk and a biopsy obtained on June 2017 showed a Gleason score 3+4 = 7 in 5 cores predominantly on the left side with moderate volume. He remains asymptomatic at this time without any lower urinary tract symptoms. He denied any nocturia, hematuria or frequency. He remains active with excellent health and shape without any other symptoms. He denied any family history of prostate cancer. 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  He denied any headaches, blurry vision, syncope or seizures. He does not report any fevers, chills or sweats. He does not report any cough, wheezing or hemoptysis. He does not report any chest pain, palpitation orthopnea. He does not report any nausea, vomiting or abdominal pain. He does not report any constipation or diarrhea. He does not report any skeletal complaints. Remaining review of systems unremarkable.   Past Medical History:  Diagnosis Date  . Hypercholesteremia   . Hypertension   . Prostate cancer [facility])   :  Past Surgical History:  Procedure Laterality Date  . KNEE CARTILAGE SURGERY Right   . PROSTATE BIOPSY    . ROTATOR CUFF REPAIR    . SHOULDER ARTHROSCOPY WITH SUBACROMIAL DECOMPRESSION, ROTATOR CUFF REPAIR AND BICEP TENDON REPAIR Left 08/20/2013   Procedure: LEFT SHOULDER ARTHROSCOPY WITH SUBACROMIAL DECOMPRESSION, DISTAL CLAVICLE RESECTION AND ROTATOR CUFF REPAIR ;  Surgeon: Marin Shutter, MD;  Location: [facility];  Service: Orthopedics;  Laterality: Left;  :   Current Outpatient Prescriptions:  .  b complex vitamins tablet, Take 1 tablet by mouth daily., Disp: , Rfl:  .  diazepam (VALIUM) 5 MG tablet, Take 0.5-1 tablets (2.5-5 mg total) by mouth  every 6 (six) hours as needed for muscle spasms or sedation., Disp: 40 tablet, Rfl: 1 .  IRON PO, Take 1 tablet by mouth daily., Disp: , Rfl:  .  naproxen (NAPROSYN) 500 MG tablet, Take 1 tablet (500 mg total) by mouth 2 (two) times daily with a meal., Disp: 60 tablet, Rfl: 1 .  Olopatadine HCl (PATADAY) 0.2 % SOLN, Place 1 drop into both eyes daily as needed (allergies in eyes)., Disp: , Rfl:  .  OVER THE COUNTER MEDICATION, Take 2 capsules by mouth 2 (two) times daily. Marine B3, Disp: , Rfl:  .  OVER THE COUNTER MEDICATION, Take 1 tablet by mouth daily. True Capros, Disp: , Rfl:  .  OVER THE COUNTER MEDICATION, Take 1 tablet by mouth 2 (two) times daily. Striction BP, Disp: , Rfl:  .  oxyCODONE-acetaminophen (PERCOCET) 5-325 MG per tablet, Take 1-2 tablets by mouth every 4 (four) hours as needed., Disp: 40 tablet, Rfl: 0 .  ramipril (ALTACE) 5 MG capsule, Take 5 mg by mouth daily., Disp: , Rfl: :  No Known Allergies:  Family History  Problem Relation Age of Onset  . Diabetes Mother   :  Social History   Social History  . Marital status: Married    Spouse name: N/A  . Number of children: N/A  . Years of education: N/A   Occupational History  . Not on file.   Social History Main Topics  . Smoking status: Never Smoker  . Smokeless tobacco: Never Used  . Alcohol use Yes  Comment: maybe once a year a few shots  . Drug use: No  . Sexual activity: Yes   Other Topics Concern  . Not on file   Social History Narrative  . No narrative on file  :  Pertinent items are noted in HPI.  Exam: ECOG 0 General appearance: alert and cooperative well appearing gentleman without distress. Head: Normocephalic, without obvious abnormality anicteric. Throat: lips, mucosa, and tongue normal; teeth and gums normal. No oral ulcers or lesions. Neck: no adenopathy or masses. Back: negative Resp: clear to auscultation bilaterally no wheezing or dullness to percussion. Chest wall: no  tenderness to palpation. Cardio: regular rate and rhythm, S1, S2 normal, no murmur, click, rub or gallop GI: soft, non-tender; bowel sounds normal; no masses,  no organomegaly no shifting dullness or ascites. Extremities: extremities normal, atraumatic, no cyanosis or edema Pulses: 2+ and symmetric Skin: Skin color, texture, turgor normal. No rashes or lesions      Assessment and Plan:    65 year old with prostate cancer diagnosed in June 2017. He was found to have a Gleason score of 3+4 = 7 in 5 cores of the left side of the prostate. His PSA is 4.48 with clinical stage of T1c.  His case was discussed today the prostate cancer multidisciplinary clinic. His pathology was discussed with the reviewing pathologist. Options of therapy were reviewed with the patient which include surgical therapy with a radical prostatectomy versus radiation therapy as a definitive modality. Risks and benefits of both approaches were reviewed and the efficacy have been discussed which is equivalent.  He is favoring surgical therapy which she would be a reasonable candidate for. See no role for systemic therapy at this point especially in the setting of a definitive surgical therapy for T1c disease. All his questions were answered today to his satisfaction.

## 2015-12-09 NOTE — Progress Notes (Signed)
                               Care Plan Summary  Name: Henry Harrison, Henry Harrison DOB: 06-05-1950   Your Medical Team:   Urologist -  Dr. Raynelle Bring, [facility]  Radiation Oncologist - Dr. Tyler Pita, [facility]   Medical Oncologist - Dr. Zola Button, [facility]  Recommendations: 1) Surgery-Robotic Prostatectomy 2) Radiation Therapy  3) Brachytherapy  * These recommendations are based on information available as of today's consult.      Recommendations may change depending on the results of further tests or exams.  Next Steps: 1) Dr. Lynne [facility] office will call you to schedule surgery    When appointments need to be scheduled, you will be contacted by [facility] and/or [facility].  Questions?  Please do not hesitate to call Cira Rue, RN, BSN, OCN at [phone] any questions or concerns.  Shirlean Mylar is your Oncology Nurse Navigator and is available to assist you while you're receiving your medical care at [facility].

## 2015-12-09 NOTE — Consult Note (Signed)
Multi-Disciplinary Clinic     12/09/2015   --------------------------------------------------------------------------------   Henry Harrison  MRN: [id number]  PRIMARY CARE:  Wenda Low, MD  DOB: January 04, 1951, 65 year old Male  REFERRING:  Georgette Dover, MD  SSN:-[id number]  PROVIDER:  Festus Aloe, M.D.    TREATING:  Raynelle Bring, M.D.   --------------------------------------------------------------------------------   CC/HPI: CC: Prostate Cancer   Physician requesting consult: Dr. Eda Keys  PCP: Dr. Wenda Low  Location of consult: [facility] - Prostate Cancer Multidisciplinary Clinic   Henry Harrison is a 65 year old gentleman who was noted to have an elevated PSA of 4.48 prompting a TRUS biopsy of the prostate demonstrating Gleason 3+4=7 adenocarcinoma of the prostate with 5 out of 12 biopsy cores positive for malignancy.   Family history: None   Imaging studies: N/A   PMH: He has a history of hypertension.  PSH: No abdominal surgeries.   TNM stage: cT1c Nx Mx  PSA: 4.48  Gleason score: 3+4=7  Biopsy (10/21/15): 5/12 cores positive  Left: L lateral apex (20%, 3+4=7), L apex (10%, 3+4=7), L lateral mid (80%, 3+4=7), L mid (50%, 3+4=7), L lateral base (30%, 3+4=7)  Right: Benign  Prostate volume: 37.0 cc   Nomogram  OC disease: 44%  EPE: 55%  SVI: 4%  LNI: 3%  PFS (5 year, 10 year): 89%,81%   Urinary function: IPSS is 3.  Erectile function: SHIM score is 25.     ALLERGIES: ramipril    MEDICATIONS: AmLODIPine Besylate 5 MG Oral Tablet Oral  Grape Seed Extract TABS Oral  Green Tea CAPS Oral  LevoFLOXacin 500 MG Oral Tablet Oral  Pomegranate CAPS Oral  Vitamin B-12 TABS Oral  Vitamin K TABS Oral     GU PSH: Prostate Needle Biopsy - 10/21/2015      PSH Notes: Rotator Cuff Repair   NON-GU PSH: Surgical Pathology, Gross And Microscopic Examination For Prostate Needle - 10/21/2015    GU PMH: Prostate Cancer -  11/24/2015 Elevated PSA - 10/21/2015, Elevated PSA, PSA elevation - 09/07/2015 BPH w/o LUTS, BPH without urinary obstruction - 09/05/2015    NON-GU PMH: Encounter for general adult medical examination without abnormal findings, Encounter for preventive health examination - 09/05/2015 Personal history of other diseases of the circulatory system, History of hypertension - 09/05/2015    FAMILY HISTORY: Diabetes - Runs In Family Glaucoma - Runs In Family heart failure - Runs In Family Hypertension - Runs In Family   SOCIAL HISTORY: None    Notes: Never a smoker, Father deceased, Retired, Two children, Married, Daily caffeine consumption, 1 serving a day, No alcohol use   REVIEW OF SYSTEMS:    GU Review Male:   Patient denies frequent urination, hard to postpone urination, burning/ pain with urination, get up at night to urinate, leakage of urine, stream starts and stops, trouble starting your streams, and have to strain to urinate .  Gastrointestinal (Lower):   Patient denies diarrhea and constipation.  Gastrointestinal (Upper):   Patient denies nausea and vomiting.  Constitutional:   Patient denies fever, night sweats, weight loss, and fatigue.  Skin:   Patient denies skin rash/ lesion and itching.  Eyes:   Patient denies blurred vision and double vision.  Ears/ Nose/ Throat:   Patient denies sore throat and sinus problems.  Hematologic/Lymphatic:   Patient denies swollen glands and easy bruising.  Cardiovascular:   Patient denies leg swelling and chest pains.  Respiratory:   Patient denies cough and shortness  of breath.  Endocrine:   Patient denies excessive thirst.  Musculoskeletal:   Patient denies back pain and joint pain.  Neurological:   Patient denies headaches and dizziness.  Psychologic:   Patient denies depression and anxiety.   VITAL SIGNS: None   GU PHYSICAL EXAMINATION:    Prostate: 40 cc with no nodularity or induration.  Seminal Vesicles: Nonpalpable.  Sphincter Tone: Normal  sphincter. No rectal tenderness. No rectal mass.    MULTI-SYSTEM PHYSICAL EXAMINATION:    Constitutional: Well-nourished. No physical deformities. Normally developed. Good grooming.  Neck: Neck symmetrical, not swollen. Normal tracheal position.  Respiratory: No labored breathing, no use of accessory muscles.   Cardiovascular: Normal temperature, normal extremity pulses, no swelling, no varicosities.  Lymphatic: No enlargement of neck, axillae, groin.  Skin: No paleness, no jaundice, no cyanosis. No lesion, no ulcer, no rash.  Neurologic / Psychiatric: Oriented to time, oriented to place, oriented to person. No depression, no anxiety, no agitation.  Gastrointestinal: No mass, no tenderness, no rigidity, non obese abdomen.  Eyes: Normal conjunctivae. Normal eyelids.  Ears, Nose, Mouth, and Throat: Left ear no scars, no lesions, no masses. Right ear no scars, no lesions, no masses. Nose no scars, no lesions, no masses. Normal hearing. Normal lips.  Musculoskeletal: Normal gait and station of head and neck.     PAST DATA REVIEWED:  Source Of History:  Patient  Lab Test Review:   PSA  Records Review:   Pathology Reports, Previous Patient Records   09/06/15  PSA  Total PSA 4.48   Free PSA 0.66   % Free PSA 15     PROCEDURES: None   ASSESSMENT:      ICD-10 Details  1 GU:   Prostate Cancer - C61    PLAN:           Document Letter(s):  Created for Patient: Clinical Summary         Notes:   1. Prostate cancer: The patient was counseled about the natural history of prostate cancer and the standard treatment options that are available for prostate cancer. It was explained to him how his age and life expectancy, clinical stage, Gleason score, and PSA affect his prognosis, the decision to proceed with additional staging studies, as well as how that information influences recommended treatment strategies. We discussed the roles for active surveillance, radiation therapy, surgical therapy,  androgen deprivation, as well as ablative therapy options for the treatment of prostate cancer as appropriate to his individual cancer situation. We discussed the risks and benefits of these options with regard to their impact on cancer control and also in terms of potential adverse events, complications, and impact on quality of life particularly related to urinary and sexual function. The patient was encouraged to ask questions throughout the discussion today and all questions were answered to his stated satisfaction. In addition, the patient was provided with and/or directed to appropriate resources and literature for further education about prostate cancer and treatment options.   We discussed surgical therapy for prostate cancer including the different available surgical approaches. We discussed, in detail, the risks and expectations of surgery with regard to cancer control, urinary control, and erectile function as well as the expected postoperative recovery process. Additional risks of surgery including but not limited to bleeding, infection, hernia formation, nerve damage, lymphocele formation, bowel/rectal injury potentially necessitating colostomy, damage to the urinary tract resulting in urine leakage, urethral stricture, and the cardiopulmonary risks such as myocardial infarction, stroke, death, venothromboembolism, etc.  were explained. The risk of open surgical conversion for robotic/laparoscopic prostatectomy was also discussed.   He has seen Dr. Tammi Klippel and Dr. Alen Blew today as well. He ultimately has made the decision to proceed with surgical treatment. He will be scheduled for a bilateral nerve sparing RAL radical prostatectomy and BPLND.   cc: Dr. Eda Keys  Dr. Wenda Low  Dr. Tyler Pita  Dr. Zola Button           E & M CODE: I spent at least 40 minutes face to face with the patient, more than 50% of that time was spent on counseling and/or coordinating care.         The information contained in this medical record document is considered private and confidential patient information. This information can only be used for the medical diagnosis and/or medical services that are being provided by the patient's selected caregivers. This information can only be distributed outside of the patient's care if the patient agrees and signs waivers of authorization for this information to be sent to an outside source or route.

## 2015-12-12 ENCOUNTER — Encounter: Payer: Self-pay | Admitting: General Practice

## 2015-12-12 ENCOUNTER — Other Ambulatory Visit: Payer: Self-pay | Admitting: Urology

## 2015-12-12 NOTE — Progress Notes (Signed)
[place] Psychosocial Distress Screening Spiritual Care  Met with [name] in [facility] to introduce [place] team/resources, reviewing distress screen per protocol.  The patient scored a 4 on the Psychosocial Distress Thermometer which indicates moderate distress. Also assessed for distress and other psychosocial needs.   ONCBCN DISTRESS SCREENING 12/12/2015  Screening Type Initial Screening  Distress experienced in past week (1-10) 4  Emotional problem type Nervousness/Anxiety  Referral to support programs Yes  Other Spiritual Care   [name] verbalized appreciation for [facility], noting that clarity about his medical situation and opportunity to meet with team helped decrease his distress to a 2. He reports good support from his wife and appears to be very self-aware, paying attention to his limits (for example, after rotator cuff surgery) and practicing self-care accordingly. He served in the [employer] in [place].  He notes that "fall is my season," sharing how outdoor time and deer hunting are integral to his meaning-making and spiritual/emotional life.   Follow up needed: No.  Pt indicates no needs at this time and is aware of ongoing [place] team/programming availability, but please also page if needs arise/circumstances change.  Thank you.  [place], [place], [facility] Pager [phone] Voicemail [phone]

## 2015-12-13 NOTE — Addendum Note (Signed)
Encounter addended by: Heywood Footman, RN on: 12/13/2015 11:59 AM<BR>    Actions taken: Charge Capture section accepted

## 2015-12-13 NOTE — Addendum Note (Signed)
Encounter addended by: Heywood Footman, RN on: 12/13/2015  4:15 PM<BR>    Actions taken: Vitals modified

## 2015-12-14 ENCOUNTER — Encounter: Payer: Self-pay | Admitting: Radiation Oncology

## 2015-12-14 NOTE — Progress Notes (Signed)
Retired. Married to [employer]. Two children, Brig and [place]. Colonoscopy done 08/2011. Denies performing self testicular exams. Wears glasses. Reports sinus problems and a sore throat. Reports back pain.

## 2015-12-14 NOTE — Addendum Note (Signed)
Encounter addended by: Heywood Footman, RN on: 12/14/2015  9:00 AM<BR>    Actions taken: Reconcile Outside Information medication data discarded, Outside historical medications filed, Home Medications modified, Medication taking status modified, Order Reconciliation Section accessed, Sign clinical note, Flowsheet accepted

## 2015-12-20 NOTE — Patient Instructions (Addendum)
Henry Harrison  12/20/2015   Your procedure is scheduled on: 01-02-16   Report to [facility] Main  Entrance take [facility]  elevators to 3rd floor to  [place] at  [place]  AM.  Call this number if you have problems the morning of surgery [phone] Follow any bowel prep instructions per Dr. Lynne Logan office- Drink clear liquids plentiful day before surgery.   Remember: ONLY 1 PERSON MAY GO WITH YOU TO SHORT STAY TO GET  READY MORNING OF YOUR SURGERY.  Do not eat food or drink liquids :After Midnight.     Take these medicines the morning of surgery with A SIP OF WATER: Amlodipine. Eye drops usual. DO NOT TAKE ANY DIABETIC MEDICATIONS DAY OF YOUR SURGERY                               You may not have any metal on your body including hair pins and              piercings  Do not wear jewelry, make-up, lotions, powders or perfumes, deodorant             Do not wear nail polish.  Do not shave  48 hours prior to surgery.              Men may shave face and neck.   Do not bring valuables to the hospital. [place].  Contacts, dentures or bridgework may not be worn into surgery.  Leave suitcase in the car. After surgery it may be brought to your room.     Patients discharged the day of surgery will not be allowed to drive home.  Name and phone number of your driver:Henry Harrison - spouse [phone] cell  Special Instructions: N/A              Please read over the following fact sheets you were given: _____________________________________________________________________             [facility] - Preparing for Surgery Before surgery, you can play an important role.  Because skin is not sterile, your skin needs to be as free of germs as possible.  You can reduce the number of germs on your skin by washing with CHG (chlorahexidine gluconate) soap before surgery.  CHG is an antiseptic cleaner which kills germs and bonds  with the skin to continue killing germs even after washing. Please DO NOT use if you have an allergy to CHG or antibacterial soaps.  If your skin becomes reddened/irritated stop using the CHG and inform your nurse when you arrive at Short Stay. Do not shave (including legs and underarms) for at least 48 hours prior to the first CHG shower.  You may shave your face/neck. Please follow these instructions carefully:  1.  Shower with CHG Soap the night before surgery and the  morning of Surgery.  2.  If you choose to wash your hair, wash your hair first as usual with your  normal  shampoo.  3.  After you shampoo, rinse your hair and body thoroughly to remove the  shampoo.  4.  Use CHG as you would any other liquid soap.  You can apply chg directly  to the skin and wash                       Gently with a scrungie or clean washcloth.  5.  Apply the CHG Soap to your body ONLY FROM THE NECK DOWN.   Do not use on face/ open                           Wound or open sores. Avoid contact with eyes, ears mouth and genitals (private parts).                       Wash face,  Genitals (private parts) with your normal soap.             6.  Wash thoroughly, paying special attention to the area where your surgery  will be performed.  7.  Thoroughly rinse your body with warm water from the neck down.  8.  DO NOT shower/wash with your normal soap after using and rinsing off  the CHG Soap.                9.  Pat yourself dry with a clean towel.            10.  Wear clean pajamas.            11.  Place clean sheets on your bed the night of your first shower and do not  sleep with pets. Day of Surgery : Do not apply any lotions/deodorants the morning of surgery.  Please wear clean clothes to the hospital/surgery center.  FAILURE TO FOLLOW THESE INSTRUCTIONS MAY RESULT IN THE CANCELLATION OF YOUR SURGERY PATIENT SIGNATURE_________________________________  NURSE  SIGNATURE__________________________________  ________________________________________________________________________   Henry Harrison  An incentive spirometer is a tool that can help keep your lungs clear and active. This tool measures how well you are filling your lungs with each breath. Taking long deep breaths may help reverse or decrease the chance of developing breathing (pulmonary) problems (especially infection) following:  A long period of time when you are unable to move or be active. BEFORE THE PROCEDURE   If the spirometer includes an indicator to show your best effort, your nurse or respiratory therapist will set it to a desired goal.  If possible, sit up straight or lean slightly forward. Try not to slouch.  Hold the incentive spirometer in an upright position. INSTRUCTIONS FOR USE  1. Sit on the edge of your bed if possible, or sit up as far as you can in bed or on a chair. 2. Hold the incentive spirometer in an upright position. 3. Breathe out normally. 4. Place the mouthpiece in your mouth and seal your lips tightly around it. 5. Breathe in slowly and as deeply as possible, raising the piston or the ball toward the top of the column. 6. Hold your breath for 3-5 seconds or for as long as possible. Allow the piston or ball to fall to the bottom of the column. 7. Remove the mouthpiece from your mouth and breathe out normally. 8. Rest for a few seconds and repeat Steps 1 through 7 at least 10 times every 1-2 hours when you are awake. Take your time and take a few normal breaths between deep breaths. 9. The spirometer may include an indicator to show  your best effort. Use the indicator as a goal to work toward during each repetition. 10. After each set of 10 deep breaths, practice coughing to be sure your lungs are clear. If you have an incision (the cut made at the time of surgery), support your incision when coughing by placing a pillow or rolled up towels firmly  against it. Once you are able to get out of bed, walk around indoors and cough well. You may stop using the incentive spirometer when instructed by your caregiver.  RISKS AND COMPLICATIONS  Take your time so you do not get dizzy or light-headed.  If you are in pain, you may need to take or ask for pain medication before doing incentive spirometry. It is harder to take a deep breath if you are having pain. AFTER USE  Rest and breathe slowly and easily.  It can be helpful to keep track of a log of your progress. Your caregiver can provide you with a simple table to help with this. If you are using the spirometer at home, follow these instructions: [place] IF:   You are having difficultly using the spirometer.  You have trouble using the spirometer as often as instructed.  Your pain medication is not giving enough relief while using the spirometer.  You develop fever of 100.5 F (38.1 C) or higher. SEEK IMMEDIATE MEDICAL CARE IF:   You cough up bloody sputum that had not been present before.  You develop fever of 102 F (38.9 C) or greater.  You develop worsening pain at or near the incision site. MAKE SURE YOU:   Understand these instructions.  Will watch your condition.  Will get help right away if you are not doing well or get worse. Document Released: 09/03/2006 Document Revised: 07/16/2011 Document Reviewed: 11/04/2006 ExitCare Patient Information 2014 ExitCare, [place].   ________________________________________________________________________  WHAT IS A BLOOD TRANSFUSION? Blood Transfusion Information  A transfusion is the replacement of blood or some of its parts. Blood is made up of multiple cells which provide different functions.  Red blood cells carry oxygen and are used for blood loss replacement.  White blood cells fight against infection.  Platelets control bleeding.  Plasma helps clot blood.  Other blood products are available for  specialized needs, such as hemophilia or other clotting disorders. BEFORE THE TRANSFUSION  Who gives blood for transfusions?   Healthy volunteers who are fully evaluated to make sure their blood is safe. This is blood bank blood. Transfusion therapy is the safest it has ever been in the practice of medicine. Before blood is taken from a donor, a complete history is taken to make sure that person has no history of diseases nor engages in risky social behavior (examples are intravenous drug use or sexual activity with multiple partners). The donor's travel history is screened to minimize risk of transmitting infections, such as malaria. The donated blood is tested for signs of infectious diseases, such as HIV and hepatitis. The blood is then tested to be sure it is compatible with you in order to minimize the chance of a transfusion reaction. If you or a relative donates blood, this is often done in anticipation of surgery and is not appropriate for emergency situations. It takes many days to process the donated blood. RISKS AND COMPLICATIONS Although transfusion therapy is very safe and saves many lives, the main dangers of transfusion include:   Getting an infectious disease.  Developing a transfusion reaction. This is an allergic reaction to  something in the blood you were given. Every precaution is taken to prevent this. The decision to have a blood transfusion has been considered carefully by your caregiver before blood is given. Blood is not given unless the benefits outweigh the risks. AFTER THE TRANSFUSION  Right after receiving a blood transfusion, you will usually feel much better and more energetic. This is especially true if your red blood cells have gotten low (anemic). The transfusion raises the level of the red blood cells which carry oxygen, and this usually causes an energy increase.  The nurse administering the transfusion will monitor you carefully for complications. HOME CARE  INSTRUCTIONS  No special instructions are needed after a transfusion. You may find your energy is better. Speak with your caregiver about any limitations on activity for underlying diseases you may have. SEEK MEDICAL CARE IF:   Your condition is not improving after your transfusion.  You develop redness or irritation at the intravenous (IV) site. SEEK IMMEDIATE MEDICAL CARE IF:  Any of the following symptoms occur over the next 12 hours:  Shaking chills.  You have a temperature by mouth above 102 F (38.9 C), not controlled by medicine.  Chest, back, or muscle pain.  People around you feel you are not acting correctly or are confused.  Shortness of breath or difficulty breathing.  Dizziness and fainting.  You get a rash or develop hives.  You have a decrease in urine output.  Your urine turns a dark color or changes to pink, red, or brown. Any of the following symptoms occur over the next 10 days:  You have a temperature by mouth above 102 F (38.9 C), not controlled by medicine.  Shortness of breath.  Weakness after normal activity.  The white part of the eye turns yellow (jaundice).  You have a decrease in the amount of urine or are urinating less often.  Your urine turns a dark color or changes to pink, red, or brown. Document Released: 04/20/2000 Document Revised: 07/16/2011 Document Reviewed: 12/08/2007 [facility] Patient Information 2014 [place], [place].  _______________________________________________________________________

## 2015-12-21 ENCOUNTER — Encounter (HOSPITAL_COMMUNITY): Payer: Self-pay

## 2015-12-21 ENCOUNTER — Ambulatory Visit (HOSPITAL_COMMUNITY)
Admission: RE | Admit: 2015-12-21 | Discharge: 2015-12-21 | Disposition: A | Discharge status: Home / Self Care | Payer: Medicare Other | Source: Ambulatory Visit | Attending: Urology | Admitting: Urology

## 2015-12-21 ENCOUNTER — Encounter (HOSPITAL_COMMUNITY)
Admission: RE | Admit: 2015-12-21 | Discharge: 2015-12-21 | Disposition: A | Discharge status: Home / Self Care | Payer: Medicare Other | Source: Ambulatory Visit | Attending: Urology | Admitting: Urology

## 2015-12-21 DIAGNOSIS — Z01818 Encounter for other preprocedural examination: Secondary | ICD-10-CM | POA: Insufficient documentation

## 2015-12-21 DIAGNOSIS — R938 Abnormal findings on diagnostic imaging of other specified body structures: Secondary | ICD-10-CM | POA: Insufficient documentation

## 2015-12-21 DIAGNOSIS — C61 Malignant neoplasm of prostate: Secondary | ICD-10-CM | POA: Insufficient documentation

## 2015-12-21 HISTORY — DX: Other specified disorders of eye and adnexa: H57.89

## 2015-12-21 LAB — BASIC METABOLIC PANEL
Anion gap: 5 (ref 5–15)
BUN: 16 mg/dL (ref 6–20)
CHLORIDE: 106 mmol/L (ref 101–111)
CO2: 28 mmol/L (ref 22–32)
CREATININE: 0.89 mg/dL (ref 0.61–1.24)
Calcium: 9.1 mg/dL (ref 8.9–10.3)
GFR calc Af Amer: >60 mL/min (ref >60)
GFR calc non Af Amer: >60 mL/min (ref >60)
GLUCOSE: 91 mg/dL (ref 65–99)
Potassium: 3.9 mmol/L (ref 3.5–5.1)
SODIUM: 139 mmol/L (ref 135–145)

## 2015-12-21 LAB — CBC
HEMATOCRIT: 41.3 % (ref 39.0–52.0)
Hemoglobin: 13.1 g/dL (ref 13.0–17.0)
MCH: 24.7 pg — AB (ref 26.0–34.0)
MCHC: 31.7 g/dL (ref 30.0–36.0)
MCV: 77.8 fL — AB (ref 78.0–100.0)
PLATELETS: 152 10*3/uL (ref 150–400)
RBC: 5.31 MIL/uL (ref 4.22–5.81)
RDW: 16 % — AB (ref 11.5–15.5)
WBC: 4.1 10*3/uL (ref 4.0–10.5)

## 2015-12-21 LAB — TYPE AND SCREEN
ABO/RH(D): B POS
Antibody Screen: NEGATIVE

## 2015-12-21 LAB — ABO/RH: ABO/RH(D): B POS

## 2015-12-21 NOTE — Pre-Procedure Instructions (Signed)
EKG 3'17  With chart. CXR done today per MD order.

## 2015-12-30 NOTE — H&P (Signed)
Multi-Disciplinary Clinic     12/09/2015   --------------------------------------------------------------------------------   Henry Harrison  MRN: [id number]  PRIMARY CARE:  Henry Low, MD  DOB: 1950-05-24, 65 year old Male  REFERRING:  Henry Dover, MD  SSN: -[id number]  PROVIDER:  Festus Harrison, M.D.    TREATING:  Henry Harrison, M.D.    LOCATION:  [facility] [phone]   --------------------------------------------------------------------------------   CC/HPI: CC: Prostate Cancer   Physician requesting consult: Dr. Eda Harrison  PCP: Dr. Wenda Harrison  Location of consult: [facility] - Prostate Cancer Multidisciplinary Clinic   Mr. Henry Harrison is a 65 year old gentleman who was noted to have an elevated PSA of 4.48 prompting a TRUS biopsy of the prostate demonstrating Gleason 3+4=7 adenocarcinoma of the prostate with 5 out of 12 biopsy cores positive for malignancy.   Family history: None   Imaging studies: N/A   PMH: He has a history of hypertension.  PSH: No abdominal surgeries.   TNM stage: cT1c Nx Mx  PSA: 4.48  Gleason score: 3+4=7  Biopsy (10/21/15): 5/12 cores positive  Left: L lateral apex (20%, 3+4=7), L apex (10%, 3+4=7), L lateral mid (80%, 3+4=7), L mid (50%, 3+4=7), L lateral base (30%, 3+4=7)  Right: Benign  Prostate volume: 37.0 cc   Nomogram  OC disease: 44%  EPE: 55%  SVI: 4%  LNI: 3%  PFS (5 year, 10 year): 89%,81%   Urinary function: IPSS is 3.  Erectile function: SHIM score is 25.     ALLERGIES: ramipril    MEDICATIONS: AmLODIPine Besylate 5 MG Oral Tablet Oral  Grape Seed Extract TABS Oral  Green Tea CAPS Oral  LevoFLOXacin 500 MG Oral Tablet Oral  Pomegranate CAPS Oral  Vitamin B-12 TABS Oral  Vitamin K TABS Oral     GU PSH: Prostate Needle Biopsy - 10/21/2015      PSH Notes: Rotator Cuff Repair   NON-GU PSH: Surgical Pathology, Gross And Microscopic Examination For Prostate Needle  - 10/21/2015    GU PMH: Prostate Cancer - 11/24/2015 Elevated PSA - 10/21/2015, Elevated PSA, PSA elevation - 09/07/2015 BPH w/o LUTS, BPH without urinary obstruction - 09/05/2015    NON-GU PMH: Encounter for general adult medical examination without abnormal findings, Encounter for preventive health examination - 09/05/2015 Personal history of other diseases of the circulatory system, History of hypertension - 09/05/2015    FAMILY HISTORY: Diabetes - Runs In Family Glaucoma - Runs In Family heart failure - Runs In Family Hypertension - Runs In Family   SOCIAL HISTORY: None    Notes: Never a smoker, Father deceased, Retired, Two children, Married, Daily caffeine consumption, 1 serving a day, No alcohol use   REVIEW OF SYSTEMS:    GU Review Male:   Patient denies frequent urination, hard to postpone urination, burning/ pain with urination, get up at night to urinate, leakage of urine, stream starts and stops, trouble starting your streams, and have to strain to urinate .  Gastrointestinal (Lower):   Patient denies diarrhea and constipation.  Gastrointestinal (Upper):   Patient denies nausea and vomiting.  Constitutional:   Patient denies fever, night sweats, weight loss, and fatigue.  Skin:   Patient denies skin rash/ lesion and itching.  Eyes:   Patient denies blurred vision and double vision.  Ears/ Nose/ Throat:   Patient denies sore throat and sinus problems.  Hematologic/Lymphatic:   Patient denies swollen glands and easy bruising.  Cardiovascular:   Patient denies leg  swelling and chest pains.  Respiratory:   Patient denies cough and shortness of breath.  Endocrine:   Patient denies excessive thirst.  Musculoskeletal:   Patient denies back pain and joint pain.  Neurological:   Patient denies headaches and dizziness.  Psychologic:   Patient denies depression and anxiety.   VITAL SIGNS: None   GU PHYSICAL EXAMINATION:    Prostate: 40 cc with no nodularity or induration.  Seminal  Vesicles: Nonpalpable.  Sphincter Tone: Normal sphincter. No rectal tenderness. No rectal mass.    MULTI-SYSTEM PHYSICAL EXAMINATION:    Constitutional: Well-nourished. No physical deformities. Normally developed. Good grooming.  Neck: Neck symmetrical, not swollen. Normal tracheal position.  Respiratory: No labored breathing, no use of accessory muscles.   Cardiovascular: Normal temperature, normal extremity pulses, no swelling, no varicosities.  Lymphatic: No enlargement of neck, axillae, groin.  Skin: No paleness, no jaundice, no cyanosis. No lesion, no ulcer, no rash.  Neurologic / Psychiatric: Oriented to time, oriented to place, oriented to person. No depression, no anxiety, no agitation.  Gastrointestinal: No mass, no tenderness, no rigidity, non obese abdomen.  Eyes: Normal conjunctivae. Normal eyelids.  Ears, Nose, Mouth, and Throat: Left ear no scars, no lesions, no masses. Right ear no scars, no lesions, no masses. Nose no scars, no lesions, no masses. Normal hearing. Normal lips.  Musculoskeletal: Normal gait and station of head and neck.     PAST DATA REVIEWED:  Source Of History:  Patient  Lab Test Review:   PSA  Records Review:   Pathology Reports, Previous Patient Records   09/06/15  PSA  Total PSA 4.48   Free PSA 0.66   % Free PSA 15     PROCEDURES: None   ASSESSMENT:      ICD-10 Details  1 GU:   Prostate Cancer - C61    PLAN:           Document Letter(s):  Created for Patient: Clinical Summary         Notes:   1. Prostate cancer: The patient was counseled about the natural history of prostate cancer and the standard treatment options that are available for prostate cancer. It was explained to him how his age and life expectancy, clinical stage, Gleason score, and PSA affect his prognosis, the decision to proceed with additional staging studies, as well as how that information influences recommended treatment strategies. We discussed the roles for active  surveillance, radiation therapy, surgical therapy, androgen deprivation, as well as ablative therapy options for the treatment of prostate cancer as appropriate to his individual cancer situation. We discussed the risks and benefits of these options with regard to their impact on cancer control and also in terms of potential adverse events, complications, and impact on quality of life particularly related to urinary and sexual function. The patient was encouraged to ask questions throughout the discussion today and all questions were answered to his stated satisfaction. In addition, the patient was provided with and/or directed to appropriate resources and literature for further education about prostate cancer and treatment options.   We discussed surgical therapy for prostate cancer including the different available surgical approaches. We discussed, in detail, the risks and expectations of surgery with regard to cancer control, urinary control, and erectile function as well as the expected postoperative recovery process. Additional risks of surgery including but not limited to bleeding, infection, hernia formation, nerve damage, lymphocele formation, bowel/rectal injury potentially necessitating colostomy, damage to the urinary tract resulting in urine leakage, urethral  stricture, and the cardiopulmonary risks such as myocardial infarction, stroke, death, venothromboembolism, etc. were explained. The risk of open surgical conversion for robotic/laparoscopic prostatectomy was also discussed.   He has seen Dr. Tammi Klippel and Dr. Alen Blew today as well. He ultimately has made the decision to proceed with surgical treatment. He will be scheduled for a bilateral nerve sparing RAL radical prostatectomy and BPLND.   cc: Dr. Eda Harrison  Dr. Wenda Harrison  Dr. Tyler Pita  Dr. Zola Button           E & M CODE: I spent at least 40 minutes face to face with the patient, more than 50% of that time was  spent on counseling and/or coordinating care.     * Signed by Henry Harrison, M.D. on 12/09/15 at 2:42 PM (EDT)*

## 2016-01-02 ENCOUNTER — Inpatient Hospital Stay (HOSPITAL_COMMUNITY): Payer: Medicare Other | Admitting: Registered Nurse

## 2016-01-02 ENCOUNTER — Encounter (HOSPITAL_COMMUNITY)
Admission: RE | Disposition: A | Discharge status: Home / Self Care | Payer: Self-pay | Source: Ambulatory Visit | Attending: Urology

## 2016-01-02 ENCOUNTER — Inpatient Hospital Stay (HOSPITAL_COMMUNITY)
Admission: RE | Admit: 2016-01-02 | Discharge: 2016-01-03 | DRG: 708 | Disposition: A | Discharge status: Home / Self Care | Payer: Medicare Other | Source: Ambulatory Visit | Attending: Urology | Admitting: Urology

## 2016-01-02 ENCOUNTER — Encounter (HOSPITAL_COMMUNITY): Payer: Self-pay | Admitting: *Deleted

## 2016-01-02 DIAGNOSIS — Z8249 Family history of ischemic heart disease and other diseases of the circulatory system: Secondary | ICD-10-CM | POA: Diagnosis not present

## 2016-01-02 DIAGNOSIS — C61 Malignant neoplasm of prostate: Secondary | ICD-10-CM | POA: Diagnosis present

## 2016-01-02 DIAGNOSIS — Z888 Allergy status to other drugs, medicaments and biological substances status: Secondary | ICD-10-CM

## 2016-01-02 DIAGNOSIS — I10 Essential (primary) hypertension: Secondary | ICD-10-CM | POA: Diagnosis present

## 2016-01-02 DIAGNOSIS — Z79899 Other long term (current) drug therapy: Secondary | ICD-10-CM | POA: Diagnosis not present

## 2016-01-02 HISTORY — PX: LYMPHADENECTOMY: SHX5960

## 2016-01-02 HISTORY — PX: ROBOT ASSISTED LAPAROSCOPIC RADICAL PROSTATECTOMY: SHX5141

## 2016-01-02 LAB — HEMOGLOBIN AND HEMATOCRIT, BLOOD
HEMATOCRIT: 38.5 % — AB (ref 39.0–52.0)
Hemoglobin: 12.4 g/dL — ABNORMAL LOW (ref 13.0–17.0)

## 2016-01-02 SURGERY — XI ROBOTIC ASSISTED LAPAROSCOPIC RADICAL PROSTATECTOMY LEVEL 2
Anesthesia: General

## 2016-01-02 MED ORDER — LIDOCAINE HCL (CARDIAC) 20 MG/ML IV SOLN
INTRAVENOUS | Status: DC | PRN
  Administered 2016-01-02: 25 mg via INTRATRACHEAL
  Administered 2016-01-02: 75 mg via INTRAVENOUS

## 2016-01-02 MED ORDER — PROPOFOL 10 MG/ML IV BOLUS
INTRAVENOUS | Status: DC | PRN
  Administered 2016-01-02 (×2): 50 mg via INTRAVENOUS
  Administered 2016-01-02: 200 mg via INTRAVENOUS

## 2016-01-02 MED ORDER — ONDANSETRON HCL 4 MG/2ML IJ SOLN
4.0000 mg | INTRAMUSCULAR | Status: DC | PRN
  Administered 2016-01-02: 4 mg via INTRAVENOUS
  Filled 2016-01-02: qty 2

## 2016-01-02 MED ORDER — DEXAMETHASONE SODIUM PHOSPHATE 10 MG/ML IJ SOLN
INTRAMUSCULAR | Status: AC
  Filled 2016-01-02: qty 1

## 2016-01-02 MED ORDER — KCL IN DEXTROSE-NACL 20-5-0.45 MEQ/L-%-% IV SOLN
INTRAVENOUS | Status: AC
  Filled 2016-01-02: qty 1000

## 2016-01-02 MED ORDER — MENTHOL 3 MG MT LOZG
1.0000 | LOZENGE | OROMUCOSAL | Status: DC | PRN
  Filled 2016-01-02: qty 9

## 2016-01-02 MED ORDER — BUPIVACAINE-EPINEPHRINE 0.25% -1:200000 IJ SOLN
INTRAMUSCULAR | Status: DC | PRN
  Administered 2016-01-02: 30 mL

## 2016-01-02 MED ORDER — LACTATED RINGERS IV SOLN
INTRAVENOUS | Status: DC
  Administered 2016-01-02: 09:00:00 via INTRAVENOUS

## 2016-01-02 MED ORDER — AMLODIPINE BESYLATE 5 MG PO TABS
5.0000 mg | ORAL_TABLET | Freq: Every day | ORAL | Status: DC
  Administered 2016-01-03: 5 mg via ORAL
  Filled 2016-01-02: qty 1

## 2016-01-02 MED ORDER — ROCURONIUM BROMIDE 100 MG/10ML IV SOLN
INTRAVENOUS | Status: AC
  Filled 2016-01-02: qty 1

## 2016-01-02 MED ORDER — DOCUSATE SODIUM 100 MG PO CAPS
100.0000 mg | ORAL_CAPSULE | Freq: Two times a day (BID) | ORAL | Status: DC
  Administered 2016-01-02 – 2016-01-03 (×2): 100 mg via ORAL
  Filled 2016-01-02 (×2): qty 1

## 2016-01-02 MED ORDER — SUGAMMADEX SODIUM 200 MG/2ML IV SOLN
INTRAVENOUS | Status: AC
  Filled 2016-01-02: qty 2

## 2016-01-02 MED ORDER — DEXAMETHASONE SODIUM PHOSPHATE 10 MG/ML IJ SOLN
INTRAMUSCULAR | Status: DC | PRN
Start: 2016-01-02 — End: 2016-01-02
  Administered 2016-01-02: 10 mg via INTRAVENOUS

## 2016-01-02 MED ORDER — SODIUM CHLORIDE 0.9 % IJ SOLN
INTRAMUSCULAR | Status: AC
  Filled 2016-01-02: qty 10

## 2016-01-02 MED ORDER — DIPHENHYDRAMINE HCL 50 MG/ML IJ SOLN: 12.5000 mg | Freq: Four times a day (QID) | INTRAMUSCULAR | Status: DC | PRN

## 2016-01-02 MED ORDER — SODIUM CHLORIDE 0.9 % IJ SOLN
INTRAMUSCULAR | Status: AC
  Filled 2016-01-02: qty 20

## 2016-01-02 MED ORDER — OLOPATADINE HCL 0.1 % OP SOLN
1.0000 [drp] | Freq: Two times a day (BID) | OPHTHALMIC | Status: DC
Start: 2016-01-02 — End: 2016-01-03
  Administered 2016-01-02 – 2016-01-03 (×2): 1 [drp] via OPHTHALMIC
  Filled 2016-01-02: qty 5

## 2016-01-02 MED ORDER — MIDAZOLAM HCL 2 MG/2ML IJ SOLN
INTRAMUSCULAR | Status: AC
  Filled 2016-01-02: qty 2

## 2016-01-02 MED ORDER — SUFENTANIL CITRATE 50 MCG/ML IV SOLN
INTRAVENOUS | Status: DC | PRN
  Administered 2016-01-02 (×2): 10 ug via INTRAVENOUS
  Administered 2016-01-02 (×3): 5 ug via INTRAVENOUS
  Administered 2016-01-02: 10 ug via INTRAVENOUS

## 2016-01-02 MED ORDER — LACTATED RINGERS IV SOLN
INTRAVENOUS | Status: DC | PRN
  Administered 2016-01-02: 1 mL

## 2016-01-02 MED ORDER — LACTATED RINGERS IV SOLN
INTRAVENOUS | Status: DC | PRN
  Administered 2016-01-02 (×3): via INTRAVENOUS

## 2016-01-02 MED ORDER — SUFENTANIL CITRATE 50 MCG/ML IV SOLN
INTRAVENOUS | Status: AC
  Filled 2016-01-02: qty 1

## 2016-01-02 MED ORDER — CEFAZOLIN IN D5W 1 GM/50ML IV SOLN
1.0000 g | Freq: Three times a day (TID) | INTRAVENOUS | Status: AC
  Administered 2016-01-02 – 2016-01-03 (×2): 1 g via INTRAVENOUS
  Filled 2016-01-02 (×3): qty 50

## 2016-01-02 MED ORDER — HEPARIN SODIUM (PORCINE) 1000 UNIT/ML IJ SOLN
INTRAMUSCULAR | Status: AC
  Filled 2016-01-02: qty 1

## 2016-01-02 MED ORDER — BUPIVACAINE-EPINEPHRINE (PF) 0.25% -1:200000 IJ SOLN
INTRAMUSCULAR | Status: AC
  Filled 2016-01-02: qty 30

## 2016-01-02 MED ORDER — SODIUM CHLORIDE 0.9 % IV BOLUS (SEPSIS)
1000.0000 mL | Freq: Once | INTRAVENOUS | Status: AC
  Administered 2016-01-02: 1000 mL via INTRAVENOUS

## 2016-01-02 MED ORDER — LIDOCAINE HCL (CARDIAC) 20 MG/ML IV SOLN
INTRAVENOUS | Status: AC
  Filled 2016-01-02: qty 5

## 2016-01-02 MED ORDER — ACETAMINOPHEN 325 MG PO TABS
650.0000 mg | ORAL_TABLET | ORAL | Status: DC | PRN
  Filled 2016-01-02: qty 2

## 2016-01-02 MED ORDER — ROCURONIUM BROMIDE 100 MG/10ML IV SOLN
INTRAVENOUS | Status: DC | PRN
  Administered 2016-01-02: 20 mg via INTRAVENOUS
  Administered 2016-01-02: 60 mg via INTRAVENOUS

## 2016-01-02 MED ORDER — CEFAZOLIN SODIUM-DEXTROSE 2-4 GM/100ML-% IV SOLN
2.0000 g | INTRAVENOUS | Status: AC
  Administered 2016-01-02: 2 g via INTRAVENOUS
  Filled 2016-01-02: qty 100

## 2016-01-02 MED ORDER — KETOROLAC TROMETHAMINE 15 MG/ML IJ SOLN
15.0000 mg | Freq: Four times a day (QID) | INTRAMUSCULAR | Status: DC
  Administered 2016-01-02 – 2016-01-03 (×3): 15 mg via INTRAVENOUS
  Filled 2016-01-02 (×3): qty 1

## 2016-01-02 MED ORDER — DIPHENHYDRAMINE HCL 12.5 MG/5ML PO ELIX: 12.5000 mg | ORAL_SOLUTION | Freq: Four times a day (QID) | ORAL | Status: DC | PRN

## 2016-01-02 MED ORDER — SODIUM CHLORIDE 0.9 % IR SOLN
Status: DC | PRN
  Administered 2016-01-02: 1000 mL

## 2016-01-02 MED ORDER — EPHEDRINE SULFATE 50 MG/ML IJ SOLN
INTRAMUSCULAR | Status: DC | PRN
  Administered 2016-01-02: 10 mg via INTRAVENOUS

## 2016-01-02 MED ORDER — ONDANSETRON HCL 4 MG/2ML IJ SOLN
INTRAMUSCULAR | Status: AC
  Filled 2016-01-02: qty 2

## 2016-01-02 MED ORDER — EPHEDRINE SULFATE 50 MG/ML IJ SOLN
INTRAMUSCULAR | Status: AC
  Filled 2016-01-02: qty 1

## 2016-01-02 MED ORDER — CEFAZOLIN SODIUM-DEXTROSE 2-4 GM/100ML-% IV SOLN
INTRAVENOUS | Status: AC
  Filled 2016-01-02: qty 100

## 2016-01-02 MED ORDER — HYDROMORPHONE HCL 1 MG/ML IJ SOLN
INTRAMUSCULAR | Status: AC
  Filled 2016-01-02: qty 1

## 2016-01-02 MED ORDER — ACETAMINOPHEN 500 MG PO TABS
ORAL_TABLET | ORAL | Status: AC
  Filled 2016-01-02: qty 2

## 2016-01-02 MED ORDER — MORPHINE SULFATE (PF) 10 MG/ML IV SOLN
2.0000 mg | INTRAVENOUS | Status: DC | PRN
  Filled 2016-01-02: qty 1

## 2016-01-02 MED ORDER — HYDROMORPHONE HCL 1 MG/ML IJ SOLN
0.2500 mg | INTRAMUSCULAR | Status: DC | PRN
  Administered 2016-01-02 (×2): 0.5 mg via INTRAVENOUS

## 2016-01-02 MED ORDER — ONDANSETRON HCL 4 MG/2ML IJ SOLN
INTRAMUSCULAR | Status: DC | PRN
  Administered 2016-01-02: 4 mg via INTRAVENOUS

## 2016-01-02 MED ORDER — MIDAZOLAM HCL 5 MG/5ML IJ SOLN
INTRAMUSCULAR | Status: DC | PRN
  Administered 2016-01-02: 2 mg via INTRAVENOUS

## 2016-01-02 MED ORDER — PROPOFOL 10 MG/ML IV BOLUS
INTRAVENOUS | Status: AC
  Filled 2016-01-02: qty 40

## 2016-01-02 MED ORDER — ACETAMINOPHEN 325 MG PO TABS
ORAL_TABLET | ORAL | Status: DC | PRN
  Administered 2016-01-02: 1000 mg via ORAL

## 2016-01-02 MED ORDER — SUGAMMADEX SODIUM 200 MG/2ML IV SOLN
INTRAVENOUS | Status: DC | PRN
  Administered 2016-01-02: 200 mg via INTRAVENOUS

## 2016-01-02 MED ORDER — KCL IN DEXTROSE-NACL 20-5-0.45 MEQ/L-%-% IV SOLN
INTRAVENOUS | Status: DC
  Administered 2016-01-02 – 2016-01-03 (×3): via INTRAVENOUS
  Filled 2016-01-02 (×2): qty 1000

## 2016-01-02 MED ORDER — SULFAMETHOXAZOLE-TRIMETHOPRIM 800-160 MG PO TABS: 1.0000 | ORAL_TABLET | Freq: Two times a day (BID) | ORAL | 0 refills | Status: AC

## 2016-01-02 MED ORDER — HYDROCODONE-ACETAMINOPHEN 5-325 MG PO TABS: 1.0000 | ORAL_TABLET | Freq: Four times a day (QID) | ORAL | 0 refills | Status: AC | PRN

## 2016-01-02 SURGICAL SUPPLY — 52 items
APPLICATOR COTTON TIP 6IN STRL (MISCELLANEOUS) ×4 IMPLANT
CATH FOLEY 2WAY SLVR 18FR 30CC (CATHETERS) ×4 IMPLANT
CATH ROBINSON RED A/P 16FR (CATHETERS) ×4 IMPLANT
CATH ROBINSON RED A/P 8FR (CATHETERS) ×4 IMPLANT
CATH TIEMANN FOLEY 18FR 5CC (CATHETERS) ×4 IMPLANT
CHLORAPREP W/TINT 26ML (MISCELLANEOUS) ×4 IMPLANT
CLIP LIGATING HEM O LOK PURPLE (MISCELLANEOUS) ×16 IMPLANT
COVER SURGICAL LIGHT HANDLE (MISCELLANEOUS) ×4 IMPLANT
COVER TIP SHEARS 8 DVNC (MISCELLANEOUS) ×2 IMPLANT
COVER TIP SHEARS 8MM DA VINCI (MISCELLANEOUS) ×2
CUTTER ECHEON FLEX ENDO 45 340 (ENDOMECHANICALS) ×4 IMPLANT
DECANTER SPIKE VIAL GLASS SM (MISCELLANEOUS) ×4 IMPLANT
DRAPE ARM DVNC X/XI (DISPOSABLE) ×8 IMPLANT
DRAPE COLUMN DVNC XI (DISPOSABLE) ×2 IMPLANT
DRAPE DA VINCI XI ARM (DISPOSABLE) ×8
DRAPE DA VINCI XI COLUMN (DISPOSABLE) ×2
DRAPE SURG IRRIG POUCH 19X23 (DRAPES) ×4 IMPLANT
DRSG TEGADERM 4X4.75 (GAUZE/BANDAGES/DRESSINGS) ×4 IMPLANT
ELECT REM PT RETURN 9FT ADLT (ELECTROSURGICAL) ×4
ELECTRODE REM PT RTRN 9FT ADLT (ELECTROSURGICAL) ×2 IMPLANT
GLOVE BIO SURGEON STRL SZ 6.5 (GLOVE) ×3 IMPLANT
GLOVE BIO SURGEONS STRL SZ 6.5 (GLOVE) ×1
GLOVE BIOGEL M STRL SZ7.5 (GLOVE) ×8 IMPLANT
GOWN STRL REUS W/TWL LRG LVL3 (GOWN DISPOSABLE) ×12 IMPLANT
HOLDER FOLEY CATH W/STRAP (MISCELLANEOUS) ×4 IMPLANT
IRRIG SUCT STRYKERFLOW 2 WTIP (MISCELLANEOUS) ×4
IRRIGATION SUCT STRKRFLW 2 WTP (MISCELLANEOUS) ×2 IMPLANT
IV LACTATED RINGERS 1000ML (IV SOLUTION) ×4 IMPLANT
LIQUID BAND (GAUZE/BANDAGES/DRESSINGS) ×4 IMPLANT
NDL SAFETY ECLIPSE 18X1.5 (NEEDLE) ×2 IMPLANT
NEEDLE HYPO 18GX1.5 SHARP (NEEDLE) ×3
PACK ROBOT UROLOGY CUSTOM (CUSTOM PROCEDURE TRAY) ×4 IMPLANT
RELOAD GREEN ECHELON 45 (STAPLE) ×4 IMPLANT
SEAL CANN UNIV 5-8 DVNC XI (MISCELLANEOUS) ×8 IMPLANT
SEAL XI 5MM-8MM UNIVERSAL (MISCELLANEOUS) ×8
SOLUTION ELECTROLUBE (MISCELLANEOUS) ×4 IMPLANT
SUT ETHILON 3 0 PS 1 (SUTURE) ×4 IMPLANT
SUT MNCRL 3 0 RB1 (SUTURE) ×2 IMPLANT
SUT MNCRL 3 0 VIOLET RB1 (SUTURE) ×6 IMPLANT
SUT MNCRL AB 4-0 PS2 18 (SUTURE) ×8 IMPLANT
SUT MONOCRYL 3 0 RB1 (SUTURE) ×8
SUT VIC AB 0 CT1 27 (SUTURE) ×2
SUT VIC AB 0 CT1 27XBRD ANTBC (SUTURE) ×2 IMPLANT
SUT VIC AB 0 UR5 27 (SUTURE) ×4 IMPLANT
SUT VIC AB 2-0 SH 27 (SUTURE) ×2
SUT VIC AB 2-0 SH 27X BRD (SUTURE) ×2 IMPLANT
SUT VICRYL 0 UR6 27IN ABS (SUTURE) ×8 IMPLANT
SYR 27GX1/2 1ML LL SAFETY (SYRINGE) ×4 IMPLANT
TOWEL OR 17X26 10 PK STRL BLUE (TOWEL DISPOSABLE) ×4 IMPLANT
TOWEL OR NON WOVEN STRL DISP B (DISPOSABLE) ×4 IMPLANT
TUBING INSUFFLATION 10FT LAP (TUBING) ×4 IMPLANT
WATER STERILE IRR 1500ML POUR (IV SOLUTION) ×8 IMPLANT

## 2016-01-02 NOTE — Transfer of Care (Signed)
Immediate Anesthesia Transfer of Care Note  Patient: Henry Harrison Seen  Procedure(s) Performed: Procedure(s): XI ROBOTIC ASSISTED LAPAROSCOPIC RADICAL PROSTATECTOMY LEVEL 2 (N/A) PELVIC LYMPHADENECTOMY (Bilateral)  Patient Location: PACU  Anesthesia Type:General  Level of Consciousness: awake, alert , oriented and patient cooperative  Airway & Oxygen Therapy: Patient Spontanous Breathing and Patient connected to face mask oxygen  Post-op Assessment: Report given to RN, Post -op Vital signs reviewed and stable and Patient moving all extremities X 4  Post vital signs: stable  Last Vitals:  Vitals:   01/02/16 0819  BP: (!) 153/100  Pulse: 70  Resp: 16  Temp: 36.6 C    Last Pain:  Vitals:   01/02/16 0819  TempSrc: Oral      Patients Stated Pain Goal: 4 ([id number])  Complications: No apparent anesthesia complications

## 2016-01-02 NOTE — Anesthesia Procedure Notes (Addendum)
Procedure Name: Intubation Date/Time: 01/02/2016 10:45 AM Performed by: Lissa Morales Pre-anesthesia Checklist: Patient identified, Emergency Drugs available, Suction available and Patient being monitored Patient Re-evaluated:Patient Re-evaluated prior to inductionOxygen Delivery Method: Circle system utilized Preoxygenation: Pre-oxygenation with 100% oxygen Intubation Type: IV induction Ventilation: Mask ventilation without difficulty Laryngoscope Size: Mac and 4 Tube type: Oral Tube size: 8.0 mm Number of attempts: 1 Airway Equipment and Method: Stylet and Oral airway Placement Confirmation: ETT inserted through vocal cords under direct vision,  positive ETCO2 and breath sounds checked- equal and bilateral Secured at: 22 cm Tube secured with: Tape Dental Injury: Teeth and Oropharynx as per pre-operative assessment

## 2016-01-02 NOTE — Op Note (Signed)

## 2016-01-02 NOTE — Anesthesia Preprocedure Evaluation (Addendum)
Anesthesia Evaluation  Patient identified by MRN, date of birth, ID band Patient awake    Reviewed: Allergy & Precautions, NPO status , Patient's Chart, lab work & pertinent test results  Airway Mallampati: II  TM Distance: >3 FB Neck ROM: Full    Dental no notable dental hx.    Pulmonary neg pulmonary ROS,    Pulmonary exam normal breath sounds clear to auscultation       Cardiovascular hypertension, Pt. on medications Normal cardiovascular exam Rhythm:Regular Rate:Normal  ECG: NSR, iRBBB   Neuro/Psych negative neurological ROS  negative psych ROS   GI/Hepatic negative GI ROS, Neg liver ROS,   Endo/Other  negative endocrine ROS  Renal/GU negative Renal ROS  negative genitourinary   Musculoskeletal negative musculoskeletal ROS (+)   Abdominal   Peds negative pediatric ROS (+)  Hematology negative hematology ROS (+)   Anesthesia Other Findings   Reproductive/Obstetrics negative OB ROS                            Anesthesia Physical Anesthesia Plan  ASA: II  Anesthesia Plan: General   Post-op Pain Management:    Induction: Intravenous  Airway Management Planned: Oral ETT  Additional Equipment:   Intra-op Plan:   Post-operative Plan: Extubation in OR  Informed Consent: I have reviewed the patients History and Physical, chart, labs and discussed the procedure including the risks, benefits and alternatives for the proposed anesthesia with the patient or authorized representative who has indicated his/her understanding and acceptance.   Dental advisory given  Plan Discussed with: CRNA  Anesthesia Plan Comments:         Anesthesia Quick Evaluation

## 2016-01-02 NOTE — Progress Notes (Signed)
Patient ID: Henry Harrison, male   DOB: 20-Feb-1951, 65 y.o.   MRN: [id number]  Post-op note  Subjective: The patient is doing well.  No complaints.  Objective: Vital signs in last 24 hours: Temp:  [97.4 F (36.3 C)-97.9 F (36.6 C)] 97.5 F (36.4 C) (08/28 1618) Pulse Rate:  [70-87] 85 (08/28 1618) Resp:  [11-16] 16 (08/28 1618) BP: (130-167)/(77-100) 167/85 (08/28 1618) SpO2:  [100 %] 100 % (08/28 1618) Weight:  [92.5 kg (204 lb)] 92.5 kg (204 lb) (08/28 0837)  Intake/Output from previous day: No intake/output data recorded. Intake/Output this shift: Total I/O In: 3175 [I.V.:2175; IV Piggyback:1000] Out: 790 [Urine:700; Drains:40; Blood:50]  Physical Exam:  General: Alert and oriented. Abdomen: Soft, Nondistended. Incisions: Clean and dry.  Lab Results:  Recent Labs  01/02/16 1421  HGB 12.4*  HCT 38.5*    Assessment/Plan: POD#0   1) Continue to monitor, ambulate, IS   Pryor Curia. MD   LOS: 0 days   Henry Harrison,LES 01/02/2016, 6:40 PM

## 2016-01-02 NOTE — Interval H&P Note (Signed)
History and Physical Interval Note:  01/02/2016 9:48 AM  Henry Harrison  has presented today for surgery, with the diagnosis of PROSTATE CANCER  The various methods of treatment have been discussed with the patient and family. After consideration of risks, benefits and other options for treatment, the patient has consented to  Procedure(s): XI ROBOTIC ASSISTED LAPAROSCOPIC RADICAL PROSTATECTOMY LEVEL 2 (N/A) PELVIC LYMPHADENECTOMY (Bilateral) as a surgical intervention .  The patient's history has been reviewed, patient examined, no change in status, stable for surgery.  I have reviewed the patient's chart and labs.  Questions were answered to the patient's satisfaction.     Tyre Beaver,LES

## 2016-01-02 NOTE — Discharge Instructions (Signed)

## 2016-01-03 ENCOUNTER — Encounter: Payer: Self-pay | Admitting: Medical Oncology

## 2016-01-03 LAB — HEMOGLOBIN AND HEMATOCRIT, BLOOD
HEMATOCRIT: 37 % — AB (ref 39.0–52.0)
HEMOGLOBIN: 12.1 g/dL — AB (ref 13.0–17.0)

## 2016-01-03 MED ORDER — HYDROCODONE-ACETAMINOPHEN 5-325 MG PO TABS
1.0000 | ORAL_TABLET | Freq: Four times a day (QID) | ORAL | Status: DC | PRN
Start: 2016-01-03 — End: 2016-01-03
  Administered 2016-01-03: 1 via ORAL
  Filled 2016-01-03: qty 1

## 2016-01-03 MED ORDER — BISACODYL 10 MG RE SUPP
10.0000 mg | Freq: Once | RECTAL | Status: AC
  Administered 2016-01-03: 10 mg via RECTAL
  Filled 2016-01-03: qty 1

## 2016-01-03 NOTE — Progress Notes (Signed)
Patient ID: Henry Harrison, male   DOB: 1951/02/20, 65 y.o.   MRN: [id number]  1 Day Post-Op Subjective: The patient is doing well.  No nausea or vomiting. Pain is adequately controlled.  Objective: Vital signs in last 24 hours: Temp:  [97.4 F (36.3 C)-98.5 F (36.9 C)] 98.5 F (36.9 C) (08/29 0500) Pulse Rate:  [70-87] 76 (08/29 0500) Resp:  [11-18] 16 (08/29 0500) BP: (124-167)/(76-100) 124/76 (08/29 0500) SpO2:  [98 %-100 %] 98 % (08/29 0500) Weight:  [92.5 kg (204 lb)] 92.5 kg (204 lb) (08/28 0837)  Intake/Output from previous day: 08/28 0701 - 08/29 0700 In: 5337.5 [I.V.:4287.5; IV Piggyback:1050] Out: 2585 [Urine:2375; Drains:160; Blood:50] Intake/Output this shift: No intake/output data recorded.  Physical Exam:  General: Alert and oriented. CV: RRR Lungs: Clear bilaterally. GI: Soft, Nondistended. Incisions: Clean, dry, and intact Urine: Clear Extremities: Nontender, no erythema, no edema.  Lab Results:  Recent Labs  01/02/16 1421 01/03/16 0525  HGB 12.4* 12.1*  HCT 38.5* 37.0*      Assessment/Plan: POD# 1 s/p robotic prostatectomy.  1) SL IVF 2) Ambulate, Incentive spirometry 3) Transition to oral pain medication 4) Dulcolax suppository 5) D/C pelvic drain 6) Plan for likely discharge later today   Pryor Curia. MD   LOS: 1 day   Andrina Locken,LES 01/03/2016, 7:53 AM

## 2016-01-03 NOTE — Anesthesia Postprocedure Evaluation (Signed)
Anesthesia Post Note  Patient: Henry Harrison  Procedure(s) Performed: Procedure(s) (LRB): XI ROBOTIC ASSISTED LAPAROSCOPIC RADICAL PROSTATECTOMY LEVEL 2 (N/A) PELVIC LYMPHADENECTOMY (Bilateral)  Patient location during evaluation: PACU Anesthesia Type: General Level of consciousness: awake and alert Pain management: pain level controlled Vital Signs Assessment: post-procedure vital signs reviewed and stable Respiratory status: spontaneous breathing, nonlabored ventilation, respiratory function stable and patient connected to nasal cannula oxygen Cardiovascular status: blood pressure returned to baseline and stable Postop Assessment: no signs of nausea or vomiting Anesthetic complications: no    Last Vitals:  Vitals:   01/02/16 2027 01/03/16 0500  BP: 126/79 124/76  Pulse: 85 76  Resp: 18 16  Temp: 36.8 C 36.9 C    Last Pain:  Vitals:   01/03/16 0800  TempSrc:   PainSc: 3                  Henry Harrison J

## 2016-01-03 NOTE — Discharge Summary (Signed)
  Date of admission: 01/02/2016  Date of discharge: 01/03/2016  Admission diagnosis: Prostate Cancer  Discharge diagnosis: Prostate Cancer  History and Physical: For full details, please see admission history and physical. Briefly, Henry Harrison is a 65 y.o. gentleman with localized prostate cancer.  After discussing management/treatment options, he elected to proceed with surgical treatment.  Hospital Course: MYLEN MANGAN was taken to the operating room on 01/02/2016 and underwent a robotic assisted laparoscopic radical prostatectomy. He tolerated this procedure well and without complications. Postoperatively, he was able to be transferred to a regular hospital room following recovery from anesthesia.  He was able to begin ambulating the night of surgery. He remained hemodynamically stable overnight.  He had excellent urine output with appropriately minimal output from his pelvic drain and his pelvic drain was removed on POD #1.  He was transitioned to oral pain medication, tolerated a clear liquid diet, and had met all discharge criteria and was able to be discharged home later on POD#1.  Laboratory values:  Recent Labs  01/02/16 1421 01/03/16 0525  HGB 12.4* 12.1*  HCT 38.5* 37.0*    Disposition: Home  Discharge instruction: He was instructed to be ambulatory but to refrain from heavy lifting, strenuous activity, or driving. He was instructed on urethral catheter care.  Discharge medications:    Medication List    STOP taking these medications   b complex vitamins tablet   IRON PO   OVER THE COUNTER MEDICATION   OVER THE COUNTER MEDICATION   OVER THE COUNTER MEDICATION     TAKE these medications   amLODipine 5 MG tablet Commonly known as:  NORVASC Take 5 mg by mouth daily.   HYDROcodone-acetaminophen 5-325 MG tablet Commonly known as:  NORCO Take 1-2 tablets by mouth every 6 (six) hours as needed for moderate pain.   PATADAY 0.2 % Soln Generic drug:   Olopatadine HCl Place 1 drop into both eyes daily as needed (allergies in eyes).   ramipril 5 MG capsule Commonly known as:  ALTACE Take 5 mg by mouth daily. No longer taking of about one year.   sulfamethoxazole-trimethoprim 800-160 MG tablet Commonly known as:  BACTRIM DS,SEPTRA DS Take 1 tablet by mouth 2 (two) times daily. Start the day prior to foley removal appointment       Followup: He will followup in 1 week for catheter removal and to discuss his surgical pathology results.

## 2016-01-03 NOTE — Progress Notes (Signed)
Pt. Ambulated with no assistive devices or O2. Pt. Tolerated ambulation well and c/o no N/V or SOB. Will continue to monitor pt. Closely.

## 2016-01-03 NOTE — Progress Notes (Signed)
JP drain removed as ordered, pt tolerated procedure well, sterile 4 X 4 guaze applied. Will continue to monitor, site care reviewed with pt and wife. Acknowledge understanding. SRP, RN

## 2016-01-03 NOTE — Progress Notes (Signed)
Oncology Nurse Navigator Documentation  Oncology Nurse Navigator Flowsheets 12/08/2015 12/09/2015 01/03/2016  Navigator Location - - -  Navigator Encounter Type Telephone Clinic/MDC -  Telephone Outgoing Call;Appt Confirmation/Clarification - -  Abnormal Finding Date - - -  Confirmed Diagnosis Date - - -  Surgery Date - - 01/02/2016  Patient Visit Type - Initial Surgery  Barriers/Navigation Needs - Education Education  Education - Understanding Cancer/ Treatment Options;Coping with Diagnosis/ Prognosis Pain/ Symptom Management  Interventions - Education Method Education Method  Education Method - Teach-back;Verbal;Written -  Support Groups/Services - Friends and Family -  Acuity - - -  Acuity Level 2 - - -  Time Spent with Patient 15 > 120 -   Mr. Stanwood doing well post op day 1 robotic prostatectomy. No major issues with pain just sore. We discussed the importance of walking and doing his deep breathing after discharge. He voiced understanding. He will follow up with Dr. Alinda Money September 5th to get his catheter out. I asked him and his wife to call me with any questions or concerns. They voiced understanding.

## 2016-12-16 IMAGING — CR DG CHEST 2V
2 series · 2 of 2 positions shown · non-contrast
Comparison: PA and lateral chest x-ray September 06, 2012

CLINICAL DATA: Preoperative examination prior to prostatectomy;
history of hypertension, hyperlipidemia, nonsmoker.

EXAM:
CHEST  2 VIEW

[w chest pa]
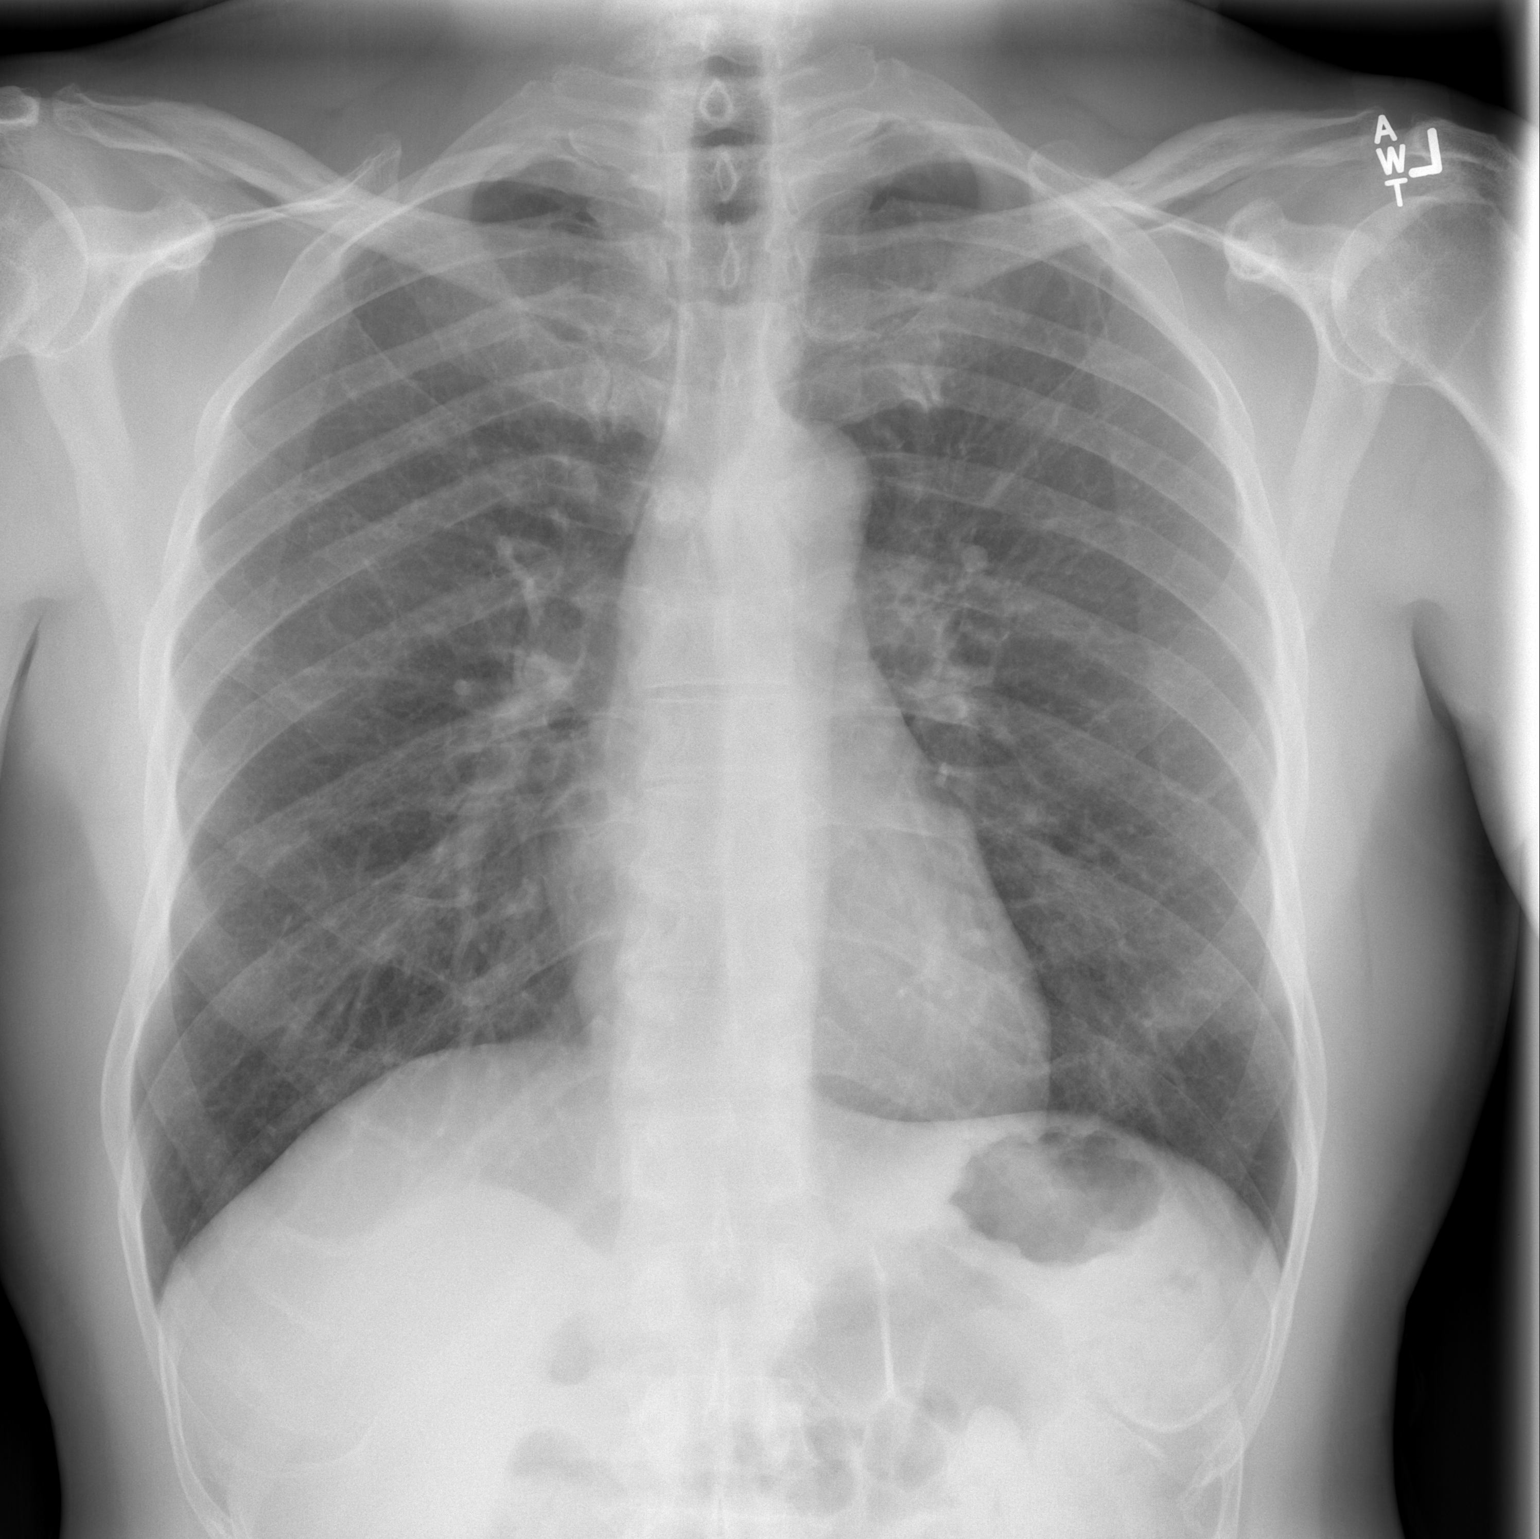

[w chest lat]
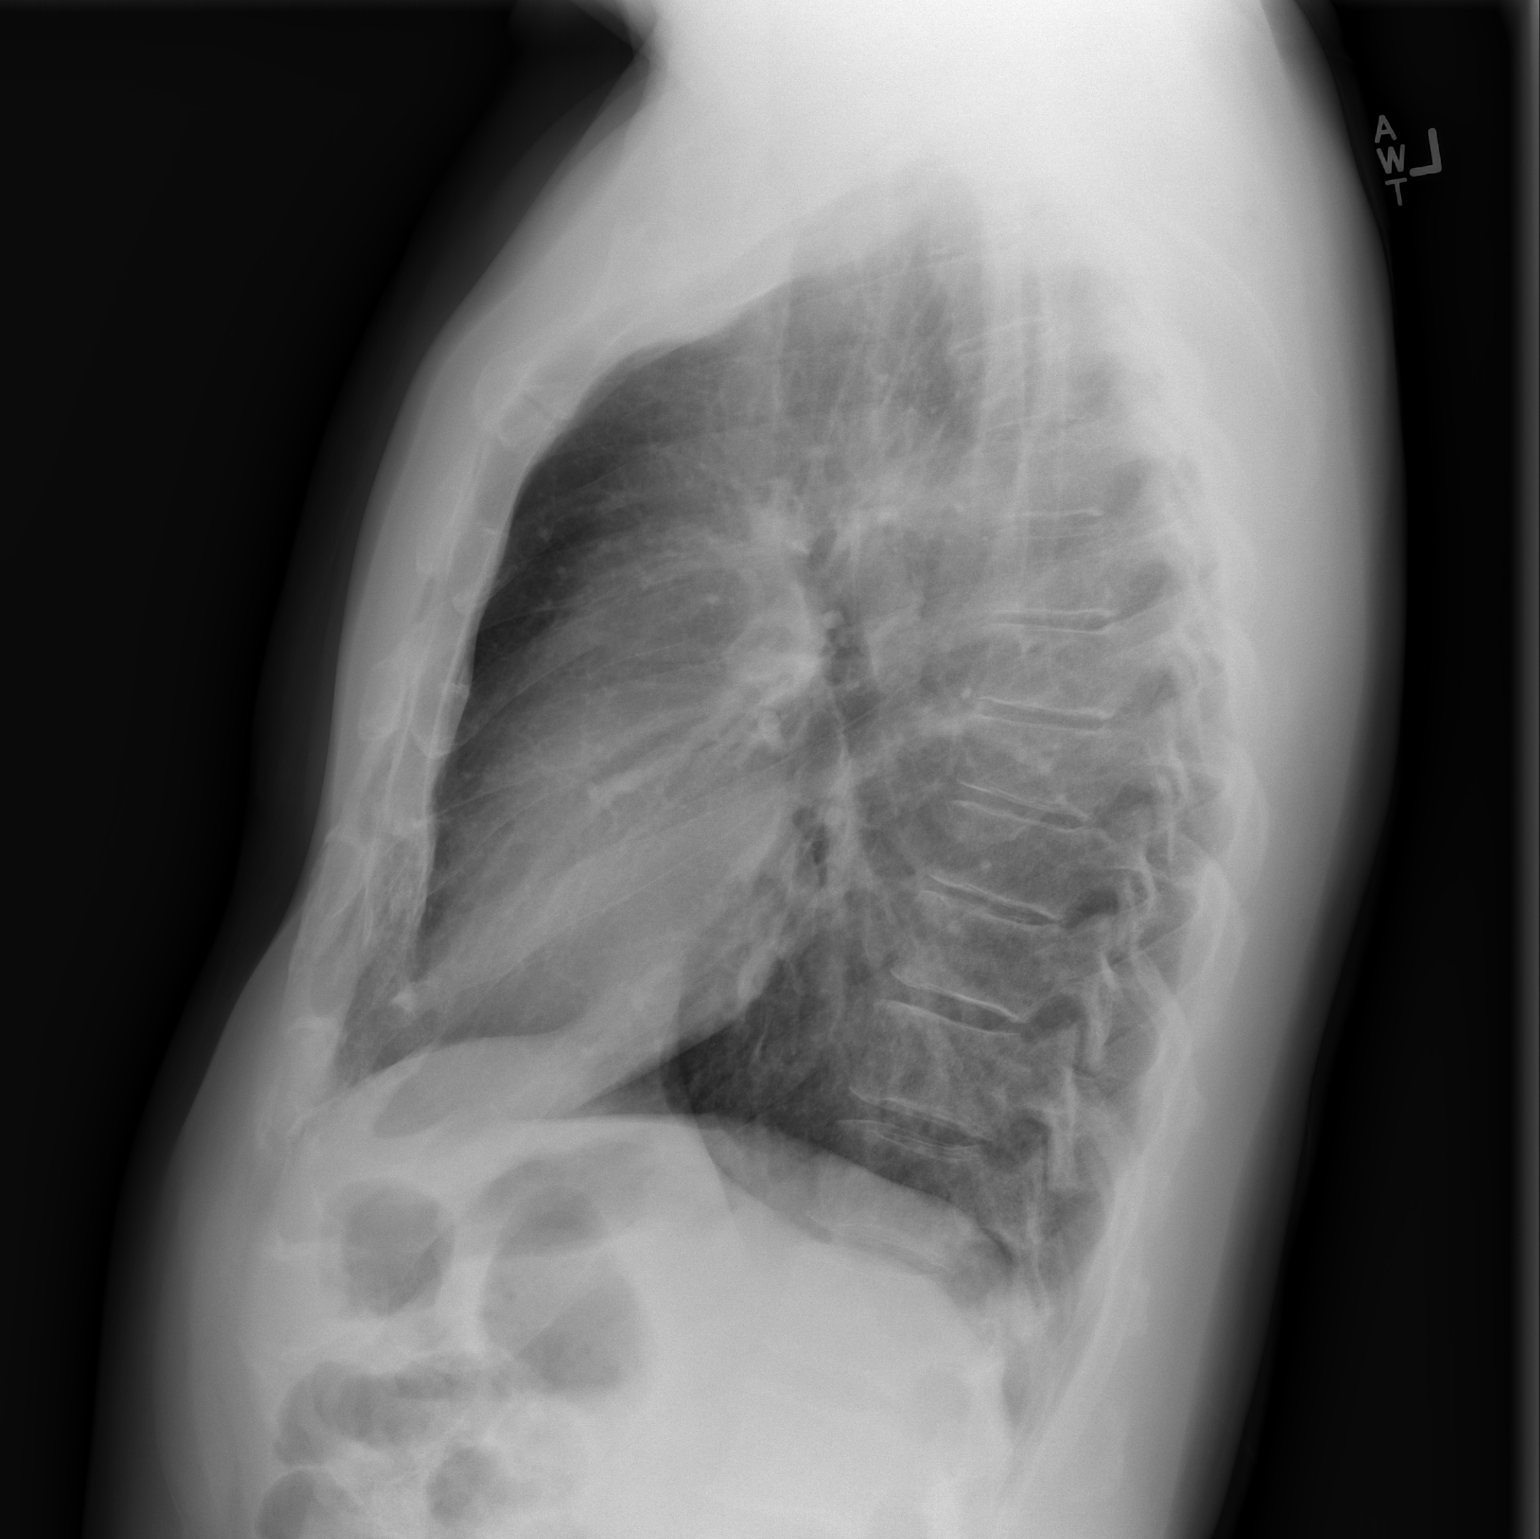

[2 of 2 positions shown; findings below may reference images not displayed]

FINDINGS: The lungs are mildly hyperinflated but clear. The heart and
pulmonary vascularity are normal. The mediastinum is normal in
width. There is no pleural effusion. The bony thorax is
unremarkable.
IMPRESSION: Mild hyperinflation which is chronic and may be due to deep
inspiratory effort or may reflect chronic bronchitic changes or
reactive airway disease. There is no active cardiopulmonary disease.

## 2017-04-02 ENCOUNTER — Encounter: Payer: Self-pay | Admitting: *Deleted

## 2017-04-02 NOTE — Progress Notes (Signed)
On 04-02-17 [facility] consult note.

## 2017-11-26 ENCOUNTER — Encounter: Payer: Self-pay | Admitting: Licensed Clinical Social Worker

## 2017-12-16 ENCOUNTER — Inpatient Hospital Stay: Payer: Medicare Other

## 2017-12-16 ENCOUNTER — Inpatient Hospital Stay: Payer: Medicare Other | Attending: Oncology | Admitting: Licensed Clinical Social Worker

## 2017-12-16 ENCOUNTER — Encounter: Payer: Self-pay | Admitting: Licensed Clinical Social Worker

## 2017-12-16 DIAGNOSIS — C61 Malignant neoplasm of prostate: Secondary | ICD-10-CM | POA: Diagnosis not present

## 2017-12-16 DIAGNOSIS — Z7183 Encounter for nonprocreative genetic counseling: Secondary | ICD-10-CM

## 2017-12-16 DIAGNOSIS — K635 Polyp of colon: Secondary | ICD-10-CM

## 2017-12-16 NOTE — Progress Notes (Signed)
REFERRING PROVIDER: Wilford Corner, MD [phone] [place], [place] [address]  PRIMARY PROVIDER:  Wenda Low, MD  PRIMARY REASON FOR VISIT:  1. Polyp of colon, unspecified part of colon, unspecified type   2. Prostate cancer ([place])      HISTORY OF PRESENT ILLNESS:   Mr. Raschke, a 67 y.o. male, was seen for a [facility] cancer genetics consultation at the request of Dr. Michail Sermon due to a personal history of multiple hamartomatous colon polyps.  Mr. Widen presents to clinic today to discuss the possibility of a hereditary predisposition to cancer, genetic testing, and to further clarify his future cancer risks, as well as potential cancer risks for family members.   In 2017, at the age of 34, Mr. Orozco was diagnosed with prostate cancer. This was treated with prostatectomy.   Colonoscopy: yes; abnormal. 2014- "multiple inflammatory polyps" 2019- "29 hamartomatous polyps" No alcohol or cigarettes, no exposures to radiation.  Does note that he was stationed at [employer]   Past Medical History:  Diagnosis Date  . Colon polyps    "29 hamartomatous polyps" 2019 colonoscopy   . Eye irritation    "allergy eyes"- uses eye drop  . Hypercholesteremia   . Hypertension   . Prostate cancer [facility])     Past Surgical History:  Procedure Laterality Date  . KNEE CARTILAGE SURGERY Right    scope  . LYMPHADENECTOMY Bilateral 01/02/2016   Procedure: PELVIC LYMPHADENECTOMY;  Surgeon: Raynelle Bring, MD;  Location: [facility];  Service: Urology;  Laterality: Bilateral;  . PROSTATE BIOPSY    . ROBOT ASSISTED LAPAROSCOPIC RADICAL PROSTATECTOMY N/A 01/02/2016   Procedure: XI ROBOTIC ASSISTED LAPAROSCOPIC RADICAL PROSTATECTOMY LEVEL 2;  Surgeon: Raynelle Bring, MD;  Location: [facility];  Service: Urology;  Laterality: N/A;  . ROTATOR CUFF REPAIR Right   . SHOULDER ARTHROSCOPY WITH SUBACROMIAL DECOMPRESSION, ROTATOR CUFF REPAIR AND BICEP TENDON REPAIR Left 08/20/2013   Procedure:  LEFT SHOULDER ARTHROSCOPY WITH SUBACROMIAL DECOMPRESSION, DISTAL CLAVICLE RESECTION AND ROTATOR CUFF REPAIR ;  Surgeon: Marin Shutter, MD;  Location: [facility];  Service: Orthopedics;  Laterality: Left;    Social History   Socioeconomic History  . Marital status: Married    Spouse name: Not on file  . Number of children: Not on file  . Years of education: Not on file  . Highest education level: Not on file  Occupational History  . Not on file  Social Needs  . Financial resource strain: Not on file  . Food insecurity:    Worry: Not on file    Inability: Not on file  . Transportation needs:    Medical: Not on file    Non-medical: Not on file  Tobacco Use  . Smoking status: Never Smoker  . Smokeless tobacco: Never Used  Substance and Sexual Activity  . Alcohol use: Yes    Comment: maybe once a year a few shots  . Drug use: No  . Sexual activity: Yes  Lifestyle  . Physical activity:    Days per week: Not on file    Minutes per session: Not on file  . Stress: Not on file  Relationships  . Social connections:    Talks on phone: Not on file    Gets together: Not on file    Attends religious service: Not on file    Active member of club or organization: Not on file    Attends meetings of clubs or organizations: Not on file    Relationship  status: Not on file  Other Topics Concern  . Not on file  Social History Narrative  . Not on file     FAMILY HISTORY:  We obtained a detailed, 4-generation family history.  Significant diagnoses are listed below: Family History  Problem Relation Age of Onset  . Diabetes Mother   . Alzheimer's disease Maternal Aunt   . Alzheimer's disease Maternal Grandmother   . Cancer Neg Hx    Mr. Kendra has two adopted sons. He has a brother living at 26, and two sisters who passed away at 72 and 17. No cancer history for his siblings. He has a nephew and two nieces, also no cancer history or known polyp history.  Mr. Gilvin's father died at  33, no cancer history or known polyp history. He had two sisters and two brothers. One of Mr. Arrona's paternal aunts is still living, the rest of his paternal aunts and uncles are deceased. No cancer or polyp history for his paternal aunts/uncles/cousins. Mr. Dennie has limited information about his paternal grandmother and grandfather. They are deceased.  Mr. Suman's mother is living at 37 with no cancer history or polyp history. She had a brother who died in his 39's in an accident and a sister who died later in life from Alzheimer's. No cancer history/polyp history for maternal cousins. His maternal grandmother died in her 9's from Alzheimer's, and he has limited information about his maternal grandfather.  Mr. Roes is unaware of previous family history of genetic testing for hereditary cancer risks. Patient's maternal ancestors are of African American/Native American/Caucasian descent, and paternal ancestors are of African American/Native American/Caucasian descent. There is no reported Ashkenazi Jewish ancestry. There is no known consanguinity.  GENETIC COUNSELING ASSESSMENT: DUANE EARNSHAW is a 67 y.o. 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 We, therefore, discussed and recommended the following at today's visit.   DISCUSSION: We reviewed the characteristics, features and inheritance patterns of hereditary cancer syndromes. We also discussed genetic testing, including the appropriate family members to test, the process of testing, insurance coverage and turn-around-time for results. We discussed the implications of a negative, positive and/or variant of uncertain significant result. We recommended Mr. Pavek pursue genetic testing for the [facility] Common Hereditary Cancers 47 gene panel.   The Common Hereditary Cancer Panel offered by [facility] includes sequencing and/or deletion duplication testing of the following 47 genes:  APC, ATM, AXIN2, BARD1, BMPR1A, BRCA1, BRCA2, BRIP1, CDH1, CDKN2A (p14ARF), CDKN2A (p16INK4a), CKD4, CHEK2, CTNNA1, DICER1, EPCAM (Deletion/duplication testing only), GREM1 (promoter region deletion/duplication testing only), KIT, MEN1, MLH1, MSH2, MSH3, MSH6, MUTYH, NBN, NF1, NHTL1, PALB2, PDGFRA, PMS2, POLD1, POLE, PTEN, RAD50, RAD51C, RAD51D, SDHB, SDHC, SDHD, SMAD4, SMARCA4. STK11, TP53, TSC1, TSC2, and VHL.  The following genes were evaluated for sequence changes only: SDHA and HOXB13 c.251G>A variant only.  We discussed that hamartomatous polyps are associated with certain syndromes such as Juvenile Polyposis syndrome (caused by BMPR1A and SMAD4 genes), Cowden syndrome (caused by PTEN gene), and Peutz Jeghers syndrome (caused by STK11 gene). These syndromes also confer risks for many cancers such as colon cancer, breast cancer, and thyroid cancer.  Colon polyposis can also be caused by mutations in the MUTYH gene, which causes a condition known as MUTYH-associated polyposis.  This is an autosomal recessive genetic condition.  In this case, an individual needs to have inherited a mutation in each copy of the MYH gene, one from each parent, in order to develop polyposis.  Carrying just one mutation of the MYH gene does not typically cause any problems or place an individual at risk for cancer.    Another syndrome we were concerned about in your family is called Lynch Syndrome, also called HNPCC (Hereditary Non-Polyposis Colon Cancer).  This syndrome increases the risk for colon, uterine, ovarian and stomach cancers, brain cancers, as well as others.  Families with Lynch Syndrome tend to have multiple family members with these cancers, typically diagnosed under age 49, and diagnoses in multiple generations. The genes that are known to cause Lynch Syndrome are called MLH1, MSH2, MSH6, PMS2 and EPCAM.     Some families that appear to have any of these conditions will have normal testing of these genes.  In  those cases it is possible that the large amount of colon polyps may be causes by a syndrome called Serrated Polyposis Syndrome.  This condition causes an abnormally large amount of serrate polyps to develop and believe to be hereditary.  However, the genetic cause of this syndrome has not yet been identified.     We discussed that if he is found to have a mutation in one of these genes, it may impact future medical management recommendations such as increased cancer screenings and consideration of risk reducing surgeries.  A positive result could also have implications for the patient's family members.   A Negative result would mean we were unable to identify a hereditary component to his development of adenomatous polyps, but does not rule out the possibility of a hereditary basis for his polyps. There could be mutations that are undetectable by current technology, or in genes not yet tested or identified to increase cancer risk.     We discussed the potential to find a Variant of Uncertain Significance or VUS.  These are variants that have not yet been identified as pathogenic or benign, and it is unknown if this variant is associated with increased cancer risk or if this is a normal finding.  Most VUS's are reclassified to benign or likely benign.   It should not be used to make medical management decisions. With time, we suspect the lab will determine the significance of any VUS's identified if any.   Based on Mr. Houser's personal history of polyps, he meets medical criteria for genetic testing. Despite that he meets criteria, he may still have an out of pocket cost. The lab will inform him of his out of pocket cost.   PLAN: After considering the risks, benefits, and limitations, Mr. Evenson  provided informed consent to pursue genetic testing and the blood sample was sent to [facility] for analysis of the Common Hereditary Cancers Panel. Results should be available within  approximately 2-3 weeks' time, at which point they will be disclosed by telephone to Mr. Terrio, as will any additional recommendations warranted by these results. Mr. Attwood will receive a summary of his genetic counseling visit and a copy of his results once available. This information will also be available in Epic. We encouraged Mr. Leinbach to remain in contact with cancer genetics annually so that we can continuously update the family history and inform him of any changes in cancer genetics and testing that may be of benefit for his family. Mr. Skalla's questions were answered to his satisfaction today. Our contact information was provided should additional questions or concerns arise.  Lastly, we encouraged Mr. Hofland to remain in contact with cancer genetics annually so that we can continuously update the family history and  inform him of any changes in cancer genetics and testing that may be of benefit for this family.   Mr.  Sexson's questions were answered to his satisfaction today. Our contact information was provided should additional questions or concerns arise. Thank you for the referral and allowing [place] to share in the care of your patient.   Epimenio Foot, MS Genetic Counselor [email or web address] Phone: [phone]  The patient was seen for a total of 30 minutes in face-to-face genetic counseling. The patient was accompanied today by his wife, Judeen Hammans.

## 2017-12-26 ENCOUNTER — Telehealth: Payer: Self-pay | Admitting: Licensed Clinical Social Worker

## 2017-12-26 ENCOUNTER — Encounter: Payer: Self-pay | Admitting: Licensed Clinical Social Worker

## 2017-12-26 ENCOUNTER — Ambulatory Visit: Payer: Self-pay | Admitting: Licensed Clinical Social Worker

## 2017-12-26 DIAGNOSIS — Z1379 Encounter for other screening for genetic and chromosomal anomalies: Secondary | ICD-10-CM

## 2017-12-26 DIAGNOSIS — K635 Polyp of colon: Secondary | ICD-10-CM

## 2017-12-26 NOTE — Telephone Encounter (Signed)
Revealed negative genetic testing.  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   We discussed that we do not know why he has this colon polyp history. It could be due to a different gene that we are not testing, or something our current technology cannot pick up.  It will be important for him to keep in contact with genetics to learn if additional testing may be needed in the future.

## 2017-12-26 NOTE — Progress Notes (Signed)
HPI:  Henry Harrison was previously seen in the [place] clinic on 12/16/2017 due to a personal history of colon polyps and concerns regarding a hereditary predisposition to cancer. Please refer to our prior cancer genetics clinic note for more information regarding Henry Harrison's medical, social and family histories, and our assessment and recommendations, at the time. Henry Harrison's recent genetic test results were disclosed to him, as well as recommendations warranted by these results. These results and recommendations are discussed in more detail below.  In 2017, at the age of 67, Henry Harrison was diagnosed with prostate cancer. This was treated with prostatectomy.   Colonoscopy: yes; abnormal. 2014- "multiple inflammatory polyps" 2019- "29 hamartomatous polyps" No alcohol or cigarettes, no exposures to radiation.  Does note that he was stationed at [employer]   FAMILY HISTORY:  We obtained a detailed, 4-generation family history.  Significant diagnoses are listed below: Family History  Problem Relation Age of Onset  . Diabetes Mother   . Alzheimer's disease Maternal Aunt   . Alzheimer's disease Maternal Grandmother   . Cancer Neg Hx    Henry Harrison has two adopted sons. He has a brother living at 23, and two sisters who passed away at 3 and 78. No cancer history for his siblings. He has a nephew and two nieces, also no cancer history or known polyp history.  Henry Harrison's father died at 62, no cancer history or known polyp history. He had two sisters and two brothers. One of Henry Harrison's paternal aunts is still living, the rest of his paternal aunts and uncles are deceased. No cancer or polyp history for his paternal aunts/uncles/cousins. Henry Harrison has limited information about his paternal grandmother and grandfather. They are deceased.  Henry Harrison's mother is living at 49 with no cancer history or polyp history. She had a brother who died in his 48's  in an accident and a sister who died later in life from Alzheimer's. No cancer history/polyp history for maternal cousins. His maternal grandmother died in her 26's from Alzheimer's, and he has limited information about his maternal grandfather.  Henry Harrison is unaware of previous family history of genetic testing for hereditary cancer risks. Patient's maternal ancestors are of African American/Native American/Caucasian descent, and paternal ancestors are of African American/Native American/Caucasian descent. There is no reported Ashkenazi Jewish ancestry. There is no known consanguinity.  GENETIC TEST RESULTS: Genetic testing performed through [facility]'s Common Hereditary Cancers Panel reported out on 12/24/2017 showed no pathogenic mutations. The Common Hereditary Cancers Panel offered by [facility] includes sequencing and/or deletion duplication testing of the following 47 genes: APC, ATM, AXIN2, BARD1, BMPR1A, BRCA1, BRCA2, BRIP1, CDH1, CDKN2A (p14ARF), CDKN2A (p16INK4a), CKD4, CHEK2, CTNNA1, DICER1, EPCAM (Deletion/duplication testing only), GREM1 (promoter region deletion/duplication testing only), KIT, MEN1, MLH1, MSH2, MSH3, MSH6, MUTYH, NBN, NF1, NHTL1, PALB2, PDGFRA, PMS2, POLD1, POLE, PTEN, RAD50, RAD51C, RAD51D, SDHB, SDHC, SDHD, SMAD4, SMARCA4. STK11, TP53, TSC1, TSC2, and VHL.  The following genes were evaluated for sequence changes only: SDHA and HOXB13 c.251G>A variant only..  A variant of uncertain significance (VUS) in a gene called APC was also noted.   The test report will be scanned into EPIC and will be located under the Molecular Pathology section of the Results Review tab. A portion of the result report is included below for reference.     We discussed with Henry Harrison that because current genetic testing is not perfect, it is possible there may be a gene mutation in one of  these genes that current testing cannot detect, but that chance is small.  We also discussed, that there  could be another gene that has not yet been discovered, or that we have not yet tested, that is responsible for the cancer diagnoses in the family. It is also possible there is a hereditary cause for the cancer in the family that Henry Harrison did not inherit and therefore was not identified in his testing.  Therefore, it is important to remain in touch with cancer genetics in the future so that we can continue to offer Henry Harrison the most up to date genetic testing.   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 It should not be used to make medical management decisions. With time, we suspect the lab will determine the significance of this variant, if any. The test report sates that familial VUS testing is recommended if clinically indicated. After speaking with the lab about Henry Harrison's family history, it was determined that he does not have a family member that would be appropriate to test for this variant. If we do learn more about this VUS, we will try to contact Henry Harrison to discuss it further. However, it is important to stay in touch with [place] periodically and keep the address and phone number up to date.  CANCER SCREENING RECOMMENDATIONS: Henry Harrison test result is considered negative (normal).  This means that we have not identified a hereditary cause for his personal history of hamartomatous colon polyps at this time.  While reassuring, this does not definitively rule out a hereditary predisposition to cancer/polyps. It is still possible that there could be genetic mutations that are undetectable by current technology, or genetic mutations in genes that have not been tested or identified to increase cancer risk.  Therefore, it is recommended he continue to follow the cancer management and screening guidelines  provided by his oncology and primary healthcare provider. An individual's cancer risk is not determined by genetic test results alone.  Overall cancer risk assessment includes additional factors such as personal medical history, family history, etc.  These should be used to make a personalized plan for cancer prevention and surveillance.    RECOMMENDATIONS FOR FAMILY MEMBERS:  We recommended women in this family have a yearly mammogram beginning at age 66, or 48 years younger than the earliest onset of cancer, an annual clinical breast exam, and perform monthly breast self-exams. Women in this family should also have a gynecological exam as recommended by their primary provider. All family members should have a colonoscopy by age 19 (or as directed by their doctors).  All family members should inform their physicians about the family history of cancer/polyps so their doctors can make the most appropriate screening recommendations for them.   FOLLOW-UP: Lastly, we discussed with Mr. Pizzi that cancer genetics is a rapidly advancing field and it is possible that new genetic tests will be appropriate for him and/or his family members in the future. We encouraged him to remain in contact with cancer genetics on an annual basis so we can update his personal and family histories and let him know of advances in cancer genetics that may benefit this family.   Our contact number was provided. Mr. Hooton's questions were answered to his satisfaction, and he knows he is welcome to call us at  anytime with additional questions or concerns.   Epimenio Foot, MS Genetic Counselor [email or web address] Phone: [phone]

## 2020-08-16 DIAGNOSIS — I10 Essential (primary) hypertension: Secondary | ICD-10-CM | POA: Diagnosis not present

## 2020-08-16 DIAGNOSIS — J309 Allergic rhinitis, unspecified: Secondary | ICD-10-CM | POA: Diagnosis not present

## 2020-08-16 DIAGNOSIS — D509 Iron deficiency anemia, unspecified: Secondary | ICD-10-CM | POA: Diagnosis not present

## 2020-08-16 DIAGNOSIS — E559 Vitamin D deficiency, unspecified: Secondary | ICD-10-CM | POA: Diagnosis not present

## 2020-08-16 DIAGNOSIS — R7303 Prediabetes: Secondary | ICD-10-CM | POA: Diagnosis not present

## 2020-08-16 DIAGNOSIS — E7800 Pure hypercholesterolemia, unspecified: Secondary | ICD-10-CM | POA: Diagnosis not present

## 2021-01-18 DIAGNOSIS — H43813 Vitreous degeneration, bilateral: Secondary | ICD-10-CM | POA: Diagnosis not present

## 2021-01-18 DIAGNOSIS — H26491 Other secondary cataract, right eye: Secondary | ICD-10-CM | POA: Diagnosis not present

## 2021-01-18 DIAGNOSIS — H04123 Dry eye syndrome of bilateral lacrimal glands: Secondary | ICD-10-CM | POA: Diagnosis not present

## 2021-01-18 DIAGNOSIS — Z961 Presence of intraocular lens: Secondary | ICD-10-CM | POA: Diagnosis not present

## 2021-01-18 DIAGNOSIS — H2512 Age-related nuclear cataract, left eye: Secondary | ICD-10-CM | POA: Diagnosis not present

## 2021-01-18 DIAGNOSIS — H40033 Anatomical narrow angle, bilateral: Secondary | ICD-10-CM | POA: Diagnosis not present

## 2021-01-31 DIAGNOSIS — H26491 Other secondary cataract, right eye: Secondary | ICD-10-CM | POA: Diagnosis not present

## 2021-02-15 DIAGNOSIS — D509 Iron deficiency anemia, unspecified: Secondary | ICD-10-CM | POA: Diagnosis not present

## 2021-02-15 DIAGNOSIS — E7800 Pure hypercholesterolemia, unspecified: Secondary | ICD-10-CM | POA: Diagnosis not present

## 2021-02-15 DIAGNOSIS — R7303 Prediabetes: Secondary | ICD-10-CM | POA: Diagnosis not present

## 2021-02-15 DIAGNOSIS — I10 Essential (primary) hypertension: Secondary | ICD-10-CM | POA: Diagnosis not present

## 2021-02-15 DIAGNOSIS — Z23 Encounter for immunization: Secondary | ICD-10-CM | POA: Diagnosis not present

## 2021-02-15 DIAGNOSIS — Z889 Allergy status to unspecified drugs, medicaments and biological substances status: Secondary | ICD-10-CM | POA: Diagnosis not present

## 2021-02-28 ENCOUNTER — Ambulatory Visit: Payer: Self-pay | Admitting: Licensed Clinical Social Worker

## 2021-02-28 NOTE — Progress Notes (Signed)
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

## 2021-03-22 DIAGNOSIS — R8271 Bacteriuria: Secondary | ICD-10-CM | POA: Diagnosis not present

## 2021-08-17 DIAGNOSIS — D72819 Decreased white blood cell count, unspecified: Secondary | ICD-10-CM | POA: Diagnosis not present

## 2021-08-17 DIAGNOSIS — J309 Allergic rhinitis, unspecified: Secondary | ICD-10-CM | POA: Diagnosis not present

## 2021-08-17 DIAGNOSIS — E7800 Pure hypercholesterolemia, unspecified: Secondary | ICD-10-CM | POA: Diagnosis not present

## 2021-08-17 DIAGNOSIS — R7303 Prediabetes: Secondary | ICD-10-CM | POA: Diagnosis not present

## 2021-08-17 DIAGNOSIS — Z0000 Encounter for general adult medical examination without abnormal findings: Secondary | ICD-10-CM | POA: Diagnosis not present

## 2021-08-17 DIAGNOSIS — D509 Iron deficiency anemia, unspecified: Secondary | ICD-10-CM | POA: Diagnosis not present

## 2021-08-17 DIAGNOSIS — G720 Drug-induced myopathy: Secondary | ICD-10-CM | POA: Diagnosis not present

## 2021-08-17 DIAGNOSIS — Z8546 Personal history of malignant neoplasm of prostate: Secondary | ICD-10-CM | POA: Diagnosis not present

## 2021-08-17 DIAGNOSIS — I10 Essential (primary) hypertension: Secondary | ICD-10-CM | POA: Diagnosis not present

## 2021-08-17 DIAGNOSIS — E559 Vitamin D deficiency, unspecified: Secondary | ICD-10-CM | POA: Diagnosis not present

## 2021-08-17 DIAGNOSIS — Z1389 Encounter for screening for other disorder: Secondary | ICD-10-CM | POA: Diagnosis not present

## 2022-02-05 DIAGNOSIS — H04123 Dry eye syndrome of bilateral lacrimal glands: Secondary | ICD-10-CM | POA: Diagnosis not present

## 2022-02-05 DIAGNOSIS — Z961 Presence of intraocular lens: Secondary | ICD-10-CM | POA: Diagnosis not present

## 2022-02-05 DIAGNOSIS — H40033 Anatomical narrow angle, bilateral: Secondary | ICD-10-CM | POA: Diagnosis not present

## 2022-02-05 DIAGNOSIS — H2512 Age-related nuclear cataract, left eye: Secondary | ICD-10-CM | POA: Diagnosis not present

## 2022-02-05 DIAGNOSIS — H43813 Vitreous degeneration, bilateral: Secondary | ICD-10-CM | POA: Diagnosis not present

## 2022-02-16 DIAGNOSIS — I10 Essential (primary) hypertension: Secondary | ICD-10-CM | POA: Diagnosis not present

## 2022-02-16 DIAGNOSIS — R300 Dysuria: Secondary | ICD-10-CM | POA: Diagnosis not present

## 2022-02-16 DIAGNOSIS — G720 Drug-induced myopathy: Secondary | ICD-10-CM | POA: Diagnosis not present

## 2022-02-16 DIAGNOSIS — Z6823 Body mass index (BMI) 23.0-23.9, adult: Secondary | ICD-10-CM | POA: Diagnosis not present

## 2022-02-16 DIAGNOSIS — D509 Iron deficiency anemia, unspecified: Secondary | ICD-10-CM | POA: Diagnosis not present

## 2022-02-16 DIAGNOSIS — Z8546 Personal history of malignant neoplasm of prostate: Secondary | ICD-10-CM | POA: Diagnosis not present

## 2022-02-16 DIAGNOSIS — Z008 Encounter for other general examination: Secondary | ICD-10-CM | POA: Diagnosis not present

## 2022-02-16 DIAGNOSIS — Z86010 Personal history of colonic polyps: Secondary | ICD-10-CM | POA: Diagnosis not present

## 2022-02-16 DIAGNOSIS — E7800 Pure hypercholesterolemia, unspecified: Secondary | ICD-10-CM | POA: Diagnosis not present

## 2022-02-16 DIAGNOSIS — R7303 Prediabetes: Secondary | ICD-10-CM | POA: Diagnosis not present

## 2022-03-13 DIAGNOSIS — Z8546 Personal history of malignant neoplasm of prostate: Secondary | ICD-10-CM | POA: Diagnosis not present

## 2022-03-20 DIAGNOSIS — N5201 Erectile dysfunction due to arterial insufficiency: Secondary | ICD-10-CM | POA: Diagnosis not present

## 2022-03-20 DIAGNOSIS — Z8546 Personal history of malignant neoplasm of prostate: Secondary | ICD-10-CM | POA: Diagnosis not present

## 2022-08-20 DIAGNOSIS — E7800 Pure hypercholesterolemia, unspecified: Secondary | ICD-10-CM | POA: Diagnosis not present

## 2022-08-20 DIAGNOSIS — D509 Iron deficiency anemia, unspecified: Secondary | ICD-10-CM | POA: Diagnosis not present

## 2022-08-20 DIAGNOSIS — Z0000 Encounter for general adult medical examination without abnormal findings: Secondary | ICD-10-CM | POA: Diagnosis not present

## 2022-08-20 DIAGNOSIS — R7303 Prediabetes: Secondary | ICD-10-CM | POA: Diagnosis not present

## 2022-08-20 DIAGNOSIS — G720 Drug-induced myopathy: Secondary | ICD-10-CM | POA: Diagnosis not present

## 2022-08-20 DIAGNOSIS — Z8546 Personal history of malignant neoplasm of prostate: Secondary | ICD-10-CM | POA: Diagnosis not present

## 2022-08-20 DIAGNOSIS — Z1331 Encounter for screening for depression: Secondary | ICD-10-CM | POA: Diagnosis not present

## 2022-08-20 DIAGNOSIS — I10 Essential (primary) hypertension: Secondary | ICD-10-CM | POA: Diagnosis not present

## 2022-08-20 DIAGNOSIS — N400 Enlarged prostate without lower urinary tract symptoms: Secondary | ICD-10-CM | POA: Diagnosis not present

## 2022-08-20 DIAGNOSIS — J309 Allergic rhinitis, unspecified: Secondary | ICD-10-CM | POA: Diagnosis not present

## 2022-08-20 DIAGNOSIS — D72819 Decreased white blood cell count, unspecified: Secondary | ICD-10-CM | POA: Diagnosis not present

## 2022-10-03 DIAGNOSIS — Z86010 Personal history of colonic polyps: Secondary | ICD-10-CM | POA: Diagnosis not present

## 2022-10-03 DIAGNOSIS — K635 Polyp of colon: Secondary | ICD-10-CM | POA: Diagnosis not present

## 2022-10-03 DIAGNOSIS — K648 Other hemorrhoids: Secondary | ICD-10-CM | POA: Diagnosis not present

## 2022-10-03 DIAGNOSIS — Z09 Encounter for follow-up examination after completed treatment for conditions other than malignant neoplasm: Secondary | ICD-10-CM | POA: Diagnosis not present

## 2022-10-10 DIAGNOSIS — K635 Polyp of colon: Secondary | ICD-10-CM | POA: Diagnosis not present

## 2023-02-13 DIAGNOSIS — H04123 Dry eye syndrome of bilateral lacrimal glands: Secondary | ICD-10-CM | POA: Diagnosis not present

## 2023-02-13 DIAGNOSIS — H43813 Vitreous degeneration, bilateral: Secondary | ICD-10-CM | POA: Diagnosis not present

## 2023-02-13 DIAGNOSIS — H40033 Anatomical narrow angle, bilateral: Secondary | ICD-10-CM | POA: Diagnosis not present

## 2023-02-13 DIAGNOSIS — Z961 Presence of intraocular lens: Secondary | ICD-10-CM | POA: Diagnosis not present

## 2023-02-13 DIAGNOSIS — H2512 Age-related nuclear cataract, left eye: Secondary | ICD-10-CM | POA: Diagnosis not present

## 2023-02-19 DIAGNOSIS — D509 Iron deficiency anemia, unspecified: Secondary | ICD-10-CM | POA: Diagnosis not present

## 2023-02-19 DIAGNOSIS — J309 Allergic rhinitis, unspecified: Secondary | ICD-10-CM | POA: Diagnosis not present

## 2023-02-19 DIAGNOSIS — Z8546 Personal history of malignant neoplasm of prostate: Secondary | ICD-10-CM | POA: Diagnosis not present

## 2023-02-19 DIAGNOSIS — G720 Drug-induced myopathy: Secondary | ICD-10-CM | POA: Diagnosis not present

## 2023-02-19 DIAGNOSIS — E7800 Pure hypercholesterolemia, unspecified: Secondary | ICD-10-CM | POA: Diagnosis not present

## 2023-02-19 DIAGNOSIS — R7303 Prediabetes: Secondary | ICD-10-CM | POA: Diagnosis not present

## 2023-02-19 DIAGNOSIS — I10 Essential (primary) hypertension: Secondary | ICD-10-CM | POA: Diagnosis not present

## 2023-02-26 DIAGNOSIS — Z0100 Encounter for examination of eyes and vision without abnormal findings: Secondary | ICD-10-CM | POA: Diagnosis not present

## 2023-03-15 DIAGNOSIS — Z8546 Personal history of malignant neoplasm of prostate: Secondary | ICD-10-CM | POA: Diagnosis not present

## 2023-03-22 DIAGNOSIS — N5201 Erectile dysfunction due to arterial insufficiency: Secondary | ICD-10-CM | POA: Diagnosis not present

## 2023-03-22 DIAGNOSIS — Z8546 Personal history of malignant neoplasm of prostate: Secondary | ICD-10-CM | POA: Diagnosis not present

## 2023-08-21 DIAGNOSIS — D509 Iron deficiency anemia, unspecified: Secondary | ICD-10-CM | POA: Diagnosis not present

## 2023-08-21 DIAGNOSIS — E559 Vitamin D deficiency, unspecified: Secondary | ICD-10-CM | POA: Diagnosis not present

## 2023-08-21 DIAGNOSIS — D72819 Decreased white blood cell count, unspecified: Secondary | ICD-10-CM | POA: Diagnosis not present

## 2023-08-21 DIAGNOSIS — G720 Drug-induced myopathy: Secondary | ICD-10-CM | POA: Diagnosis not present

## 2023-08-21 DIAGNOSIS — R7303 Prediabetes: Secondary | ICD-10-CM | POA: Diagnosis not present

## 2023-08-21 DIAGNOSIS — I10 Essential (primary) hypertension: Secondary | ICD-10-CM | POA: Diagnosis not present

## 2023-08-21 DIAGNOSIS — E7800 Pure hypercholesterolemia, unspecified: Secondary | ICD-10-CM | POA: Diagnosis not present

## 2023-08-21 DIAGNOSIS — Z1331 Encounter for screening for depression: Secondary | ICD-10-CM | POA: Diagnosis not present

## 2023-08-21 DIAGNOSIS — N400 Enlarged prostate without lower urinary tract symptoms: Secondary | ICD-10-CM | POA: Diagnosis not present

## 2023-08-21 DIAGNOSIS — Z0000 Encounter for general adult medical examination without abnormal findings: Secondary | ICD-10-CM | POA: Diagnosis not present

## 2023-08-21 DIAGNOSIS — Z8546 Personal history of malignant neoplasm of prostate: Secondary | ICD-10-CM | POA: Diagnosis not present

## 2023-09-19 DIAGNOSIS — Z008 Encounter for other general examination: Secondary | ICD-10-CM | POA: Diagnosis not present

## 2023-09-19 DIAGNOSIS — Z8546 Personal history of malignant neoplasm of prostate: Secondary | ICD-10-CM | POA: Diagnosis not present

## 2023-09-19 DIAGNOSIS — Z860100 Personal history of colon polyps, unspecified: Secondary | ICD-10-CM | POA: Diagnosis not present

## 2024-02-17 DIAGNOSIS — H04123 Dry eye syndrome of bilateral lacrimal glands: Secondary | ICD-10-CM | POA: Diagnosis not present

## 2024-02-17 DIAGNOSIS — H2512 Age-related nuclear cataract, left eye: Secondary | ICD-10-CM | POA: Diagnosis not present

## 2024-02-17 DIAGNOSIS — Z961 Presence of intraocular lens: Secondary | ICD-10-CM | POA: Diagnosis not present

## 2024-02-17 DIAGNOSIS — H40033 Anatomical narrow angle, bilateral: Secondary | ICD-10-CM | POA: Diagnosis not present

## 2024-02-17 DIAGNOSIS — H43813 Vitreous degeneration, bilateral: Secondary | ICD-10-CM | POA: Diagnosis not present

## 2024-02-20 DIAGNOSIS — Z8546 Personal history of malignant neoplasm of prostate: Secondary | ICD-10-CM | POA: Diagnosis not present

## 2024-02-20 DIAGNOSIS — E559 Vitamin D deficiency, unspecified: Secondary | ICD-10-CM | POA: Diagnosis not present

## 2024-02-20 DIAGNOSIS — R7303 Prediabetes: Secondary | ICD-10-CM | POA: Diagnosis not present

## 2024-02-20 DIAGNOSIS — G720 Drug-induced myopathy: Secondary | ICD-10-CM | POA: Diagnosis not present

## 2024-02-20 DIAGNOSIS — I10 Essential (primary) hypertension: Secondary | ICD-10-CM | POA: Diagnosis not present

## 2024-02-20 DIAGNOSIS — D509 Iron deficiency anemia, unspecified: Secondary | ICD-10-CM | POA: Diagnosis not present

## 2024-02-20 DIAGNOSIS — J309 Allergic rhinitis, unspecified: Secondary | ICD-10-CM | POA: Diagnosis not present
